# Patient Record
Sex: Male | Born: 1975
Health system: Southern US, Community
[De-identification: ages and names within clinical notes are randomized; demographics above are authoritative.]

## PROBLEM LIST (undated history)

## (undated) DIAGNOSIS — U071 COVID-19: Secondary | ICD-10-CM

## (undated) DIAGNOSIS — K5792 Diverticulitis of intestine, part unspecified, without perforation or abscess without bleeding: Secondary | ICD-10-CM

## (undated) DIAGNOSIS — K219 Gastro-esophageal reflux disease without esophagitis: Secondary | ICD-10-CM

## (undated) DIAGNOSIS — G709 Myoneural disorder, unspecified: Secondary | ICD-10-CM

## (undated) HISTORY — DX: Diverticulitis of intestine, part unspecified, without perforation or abscess without bleeding: K57.92

## (undated) HISTORY — DX: Myoneural disorder, unspecified: G70.9

## (undated) HISTORY — PX: COLONOSCOPY WITH ESOPHAGOGASTRODUODENOSCOPY (EGD): SHX5779

## (undated) HISTORY — DX: Gastro-esophageal reflux disease without esophagitis: K21.9

## (undated) HISTORY — DX: COVID-19: U07.1

---

## 2016-02-10 DIAGNOSIS — K449 Diaphragmatic hernia without obstruction or gangrene: Secondary | ICD-10-CM | POA: Diagnosis not present

## 2016-02-10 DIAGNOSIS — K5730 Diverticulosis of large intestine without perforation or abscess without bleeding: Secondary | ICD-10-CM | POA: Diagnosis not present

## 2016-02-10 DIAGNOSIS — K649 Unspecified hemorrhoids: Secondary | ICD-10-CM | POA: Diagnosis not present

## 2016-02-10 DIAGNOSIS — K228 Other specified diseases of esophagus: Secondary | ICD-10-CM | POA: Diagnosis not present

## 2016-02-10 DIAGNOSIS — K5732 Diverticulitis of large intestine without perforation or abscess without bleeding: Secondary | ICD-10-CM | POA: Diagnosis not present

## 2016-02-10 DIAGNOSIS — K219 Gastro-esophageal reflux disease without esophagitis: Secondary | ICD-10-CM | POA: Diagnosis not present

## 2016-02-10 HISTORY — PX: ESOPHAGOGASTRODUODENOSCOPY: SHX1529

## 2016-02-10 HISTORY — PX: COLONOSCOPY: SHX174

## 2016-06-22 DIAGNOSIS — Z23 Encounter for immunization: Secondary | ICD-10-CM | POA: Diagnosis not present

## 2017-01-25 DIAGNOSIS — Z6829 Body mass index (BMI) 29.0-29.9, adult: Secondary | ICD-10-CM | POA: Diagnosis not present

## 2017-01-25 DIAGNOSIS — E669 Obesity, unspecified: Secondary | ICD-10-CM | POA: Diagnosis not present

## 2017-04-22 DIAGNOSIS — I10 Essential (primary) hypertension: Secondary | ICD-10-CM | POA: Diagnosis not present

## 2017-06-21 DIAGNOSIS — M79601 Pain in right arm: Secondary | ICD-10-CM | POA: Diagnosis not present

## 2017-06-21 DIAGNOSIS — M25511 Pain in right shoulder: Secondary | ICD-10-CM | POA: Diagnosis not present

## 2017-06-21 DIAGNOSIS — M542 Cervicalgia: Secondary | ICD-10-CM | POA: Diagnosis not present

## 2017-06-24 DIAGNOSIS — M62830 Muscle spasm of back: Secondary | ICD-10-CM | POA: Diagnosis not present

## 2017-06-24 DIAGNOSIS — M542 Cervicalgia: Secondary | ICD-10-CM | POA: Diagnosis not present

## 2017-06-24 DIAGNOSIS — M5412 Radiculopathy, cervical region: Secondary | ICD-10-CM | POA: Diagnosis not present

## 2017-06-24 DIAGNOSIS — M791 Myalgia: Secondary | ICD-10-CM | POA: Diagnosis not present

## 2017-06-25 DIAGNOSIS — M25511 Pain in right shoulder: Secondary | ICD-10-CM | POA: Diagnosis not present

## 2017-06-26 DIAGNOSIS — M4802 Spinal stenosis, cervical region: Secondary | ICD-10-CM | POA: Diagnosis not present

## 2017-06-26 DIAGNOSIS — M25511 Pain in right shoulder: Secondary | ICD-10-CM | POA: Diagnosis not present

## 2017-06-26 DIAGNOSIS — M75101 Unspecified rotator cuff tear or rupture of right shoulder, not specified as traumatic: Secondary | ICD-10-CM | POA: Diagnosis not present

## 2017-06-26 DIAGNOSIS — M50223 Other cervical disc displacement at C6-C7 level: Secondary | ICD-10-CM | POA: Diagnosis not present

## 2017-06-27 DIAGNOSIS — M4802 Spinal stenosis, cervical region: Secondary | ICD-10-CM | POA: Diagnosis not present

## 2017-06-27 DIAGNOSIS — M5020 Other cervical disc displacement, unspecified cervical region: Secondary | ICD-10-CM | POA: Diagnosis not present

## 2017-06-27 DIAGNOSIS — S43439A Superior glenoid labrum lesion of unspecified shoulder, initial encounter: Secondary | ICD-10-CM | POA: Diagnosis not present

## 2017-06-28 DIAGNOSIS — M50123 Cervical disc disorder at C6-C7 level with radiculopathy: Secondary | ICD-10-CM | POA: Diagnosis not present

## 2017-06-28 DIAGNOSIS — M48062 Spinal stenosis, lumbar region with neurogenic claudication: Secondary | ICD-10-CM | POA: Diagnosis not present

## 2017-07-02 DIAGNOSIS — M791 Myalgia: Secondary | ICD-10-CM | POA: Diagnosis not present

## 2017-07-02 DIAGNOSIS — M25511 Pain in right shoulder: Secondary | ICD-10-CM | POA: Diagnosis not present

## 2017-07-04 DIAGNOSIS — M791 Myalgia: Secondary | ICD-10-CM | POA: Diagnosis not present

## 2017-07-04 DIAGNOSIS — M25511 Pain in right shoulder: Secondary | ICD-10-CM | POA: Diagnosis not present

## 2017-07-05 DIAGNOSIS — M4712 Other spondylosis with myelopathy, cervical region: Secondary | ICD-10-CM | POA: Diagnosis not present

## 2017-07-05 DIAGNOSIS — M4802 Spinal stenosis, cervical region: Secondary | ICD-10-CM | POA: Diagnosis not present

## 2017-07-05 DIAGNOSIS — M4722 Other spondylosis with radiculopathy, cervical region: Secondary | ICD-10-CM | POA: Diagnosis not present

## 2017-07-05 DIAGNOSIS — M5030 Other cervical disc degeneration, unspecified cervical region: Secondary | ICD-10-CM | POA: Diagnosis not present

## 2017-07-05 DIAGNOSIS — M5000 Cervical disc disorder with myelopathy, unspecified cervical region: Secondary | ICD-10-CM | POA: Diagnosis not present

## 2017-07-08 DIAGNOSIS — M25511 Pain in right shoulder: Secondary | ICD-10-CM | POA: Diagnosis not present

## 2017-07-08 DIAGNOSIS — M791 Myalgia: Secondary | ICD-10-CM | POA: Diagnosis not present

## 2017-07-09 DIAGNOSIS — M62838 Other muscle spasm: Secondary | ICD-10-CM | POA: Diagnosis not present

## 2017-07-09 DIAGNOSIS — M50321 Other cervical disc degeneration at C4-C5 level: Secondary | ICD-10-CM | POA: Diagnosis not present

## 2017-07-09 DIAGNOSIS — M50322 Other cervical disc degeneration at C5-C6 level: Secondary | ICD-10-CM | POA: Diagnosis not present

## 2017-07-09 DIAGNOSIS — M531 Cervicobrachial syndrome: Secondary | ICD-10-CM | POA: Diagnosis not present

## 2017-07-11 DIAGNOSIS — M531 Cervicobrachial syndrome: Secondary | ICD-10-CM | POA: Diagnosis not present

## 2017-07-11 DIAGNOSIS — M50322 Other cervical disc degeneration at C5-C6 level: Secondary | ICD-10-CM | POA: Diagnosis not present

## 2017-07-11 DIAGNOSIS — M62838 Other muscle spasm: Secondary | ICD-10-CM | POA: Diagnosis not present

## 2017-07-11 DIAGNOSIS — M50321 Other cervical disc degeneration at C4-C5 level: Secondary | ICD-10-CM | POA: Diagnosis not present

## 2017-07-12 DIAGNOSIS — M50122 Cervical disc disorder at C5-C6 level with radiculopathy: Secondary | ICD-10-CM | POA: Diagnosis not present

## 2017-07-12 DIAGNOSIS — M50123 Cervical disc disorder at C6-C7 level with radiculopathy: Secondary | ICD-10-CM | POA: Diagnosis not present

## 2017-07-12 DIAGNOSIS — M50322 Other cervical disc degeneration at C5-C6 level: Secondary | ICD-10-CM | POA: Diagnosis not present

## 2017-07-12 DIAGNOSIS — M50323 Other cervical disc degeneration at C6-C7 level: Secondary | ICD-10-CM | POA: Diagnosis not present

## 2017-07-12 DIAGNOSIS — M4802 Spinal stenosis, cervical region: Secondary | ICD-10-CM | POA: Diagnosis not present

## 2017-07-15 DIAGNOSIS — M25511 Pain in right shoulder: Secondary | ICD-10-CM | POA: Diagnosis not present

## 2017-07-15 DIAGNOSIS — M531 Cervicobrachial syndrome: Secondary | ICD-10-CM | POA: Diagnosis not present

## 2017-07-15 DIAGNOSIS — M791 Myalgia, unspecified site: Secondary | ICD-10-CM | POA: Diagnosis not present

## 2017-07-15 DIAGNOSIS — M50321 Other cervical disc degeneration at C4-C5 level: Secondary | ICD-10-CM | POA: Diagnosis not present

## 2017-07-15 DIAGNOSIS — M62838 Other muscle spasm: Secondary | ICD-10-CM | POA: Diagnosis not present

## 2017-07-15 DIAGNOSIS — M50322 Other cervical disc degeneration at C5-C6 level: Secondary | ICD-10-CM | POA: Diagnosis not present

## 2017-07-16 DIAGNOSIS — M50321 Other cervical disc degeneration at C4-C5 level: Secondary | ICD-10-CM | POA: Diagnosis not present

## 2017-07-16 DIAGNOSIS — M50322 Other cervical disc degeneration at C5-C6 level: Secondary | ICD-10-CM | POA: Diagnosis not present

## 2017-07-16 DIAGNOSIS — M62838 Other muscle spasm: Secondary | ICD-10-CM | POA: Diagnosis not present

## 2017-07-16 DIAGNOSIS — M531 Cervicobrachial syndrome: Secondary | ICD-10-CM | POA: Diagnosis not present

## 2017-07-17 DIAGNOSIS — M50321 Other cervical disc degeneration at C4-C5 level: Secondary | ICD-10-CM | POA: Diagnosis not present

## 2017-07-17 DIAGNOSIS — M50322 Other cervical disc degeneration at C5-C6 level: Secondary | ICD-10-CM | POA: Diagnosis not present

## 2017-07-17 DIAGNOSIS — M531 Cervicobrachial syndrome: Secondary | ICD-10-CM | POA: Diagnosis not present

## 2017-07-17 DIAGNOSIS — M62838 Other muscle spasm: Secondary | ICD-10-CM | POA: Diagnosis not present

## 2017-07-18 DIAGNOSIS — M50322 Other cervical disc degeneration at C5-C6 level: Secondary | ICD-10-CM | POA: Diagnosis not present

## 2017-07-18 DIAGNOSIS — M542 Cervicalgia: Secondary | ICD-10-CM | POA: Diagnosis not present

## 2017-07-18 DIAGNOSIS — M50321 Other cervical disc degeneration at C4-C5 level: Secondary | ICD-10-CM | POA: Diagnosis not present

## 2017-07-18 DIAGNOSIS — M25511 Pain in right shoulder: Secondary | ICD-10-CM | POA: Diagnosis not present

## 2017-07-18 DIAGNOSIS — M531 Cervicobrachial syndrome: Secondary | ICD-10-CM | POA: Diagnosis not present

## 2017-07-18 DIAGNOSIS — M62838 Other muscle spasm: Secondary | ICD-10-CM | POA: Diagnosis not present

## 2017-07-22 DIAGNOSIS — M531 Cervicobrachial syndrome: Secondary | ICD-10-CM | POA: Diagnosis not present

## 2017-07-22 DIAGNOSIS — M50321 Other cervical disc degeneration at C4-C5 level: Secondary | ICD-10-CM | POA: Diagnosis not present

## 2017-07-22 DIAGNOSIS — M50322 Other cervical disc degeneration at C5-C6 level: Secondary | ICD-10-CM | POA: Diagnosis not present

## 2017-07-22 DIAGNOSIS — M62838 Other muscle spasm: Secondary | ICD-10-CM | POA: Diagnosis not present

## 2017-07-23 DIAGNOSIS — M50321 Other cervical disc degeneration at C4-C5 level: Secondary | ICD-10-CM | POA: Diagnosis not present

## 2017-07-23 DIAGNOSIS — M531 Cervicobrachial syndrome: Secondary | ICD-10-CM | POA: Diagnosis not present

## 2017-07-23 DIAGNOSIS — M62838 Other muscle spasm: Secondary | ICD-10-CM | POA: Diagnosis not present

## 2017-07-23 DIAGNOSIS — M50322 Other cervical disc degeneration at C5-C6 level: Secondary | ICD-10-CM | POA: Diagnosis not present

## 2017-07-24 DIAGNOSIS — M50322 Other cervical disc degeneration at C5-C6 level: Secondary | ICD-10-CM | POA: Diagnosis not present

## 2017-07-24 DIAGNOSIS — M62838 Other muscle spasm: Secondary | ICD-10-CM | POA: Diagnosis not present

## 2017-07-24 DIAGNOSIS — M50321 Other cervical disc degeneration at C4-C5 level: Secondary | ICD-10-CM | POA: Diagnosis not present

## 2017-07-24 DIAGNOSIS — M531 Cervicobrachial syndrome: Secondary | ICD-10-CM | POA: Diagnosis not present

## 2017-07-25 DIAGNOSIS — M62838 Other muscle spasm: Secondary | ICD-10-CM | POA: Diagnosis not present

## 2017-07-25 DIAGNOSIS — M531 Cervicobrachial syndrome: Secondary | ICD-10-CM | POA: Diagnosis not present

## 2017-07-25 DIAGNOSIS — M50321 Other cervical disc degeneration at C4-C5 level: Secondary | ICD-10-CM | POA: Diagnosis not present

## 2017-07-25 DIAGNOSIS — M50322 Other cervical disc degeneration at C5-C6 level: Secondary | ICD-10-CM | POA: Diagnosis not present

## 2017-07-29 DIAGNOSIS — M50321 Other cervical disc degeneration at C4-C5 level: Secondary | ICD-10-CM | POA: Diagnosis not present

## 2017-07-29 DIAGNOSIS — M531 Cervicobrachial syndrome: Secondary | ICD-10-CM | POA: Diagnosis not present

## 2017-07-29 DIAGNOSIS — M50322 Other cervical disc degeneration at C5-C6 level: Secondary | ICD-10-CM | POA: Diagnosis not present

## 2017-07-29 DIAGNOSIS — M62838 Other muscle spasm: Secondary | ICD-10-CM | POA: Diagnosis not present

## 2017-07-30 DIAGNOSIS — M791 Myalgia, unspecified site: Secondary | ICD-10-CM | POA: Diagnosis not present

## 2017-07-30 DIAGNOSIS — M25511 Pain in right shoulder: Secondary | ICD-10-CM | POA: Diagnosis not present

## 2017-07-31 DIAGNOSIS — M531 Cervicobrachial syndrome: Secondary | ICD-10-CM | POA: Diagnosis not present

## 2017-07-31 DIAGNOSIS — M50321 Other cervical disc degeneration at C4-C5 level: Secondary | ICD-10-CM | POA: Diagnosis not present

## 2017-07-31 DIAGNOSIS — M50322 Other cervical disc degeneration at C5-C6 level: Secondary | ICD-10-CM | POA: Diagnosis not present

## 2017-07-31 DIAGNOSIS — M62838 Other muscle spasm: Secondary | ICD-10-CM | POA: Diagnosis not present

## 2017-08-01 DIAGNOSIS — M50321 Other cervical disc degeneration at C4-C5 level: Secondary | ICD-10-CM | POA: Diagnosis not present

## 2017-08-01 DIAGNOSIS — M62838 Other muscle spasm: Secondary | ICD-10-CM | POA: Diagnosis not present

## 2017-08-01 DIAGNOSIS — M50322 Other cervical disc degeneration at C5-C6 level: Secondary | ICD-10-CM | POA: Diagnosis not present

## 2017-08-01 DIAGNOSIS — M531 Cervicobrachial syndrome: Secondary | ICD-10-CM | POA: Diagnosis not present

## 2017-08-02 DIAGNOSIS — M4802 Spinal stenosis, cervical region: Secondary | ICD-10-CM | POA: Diagnosis not present

## 2017-08-02 DIAGNOSIS — M50122 Cervical disc disorder at C5-C6 level with radiculopathy: Secondary | ICD-10-CM | POA: Diagnosis not present

## 2017-08-05 DIAGNOSIS — M531 Cervicobrachial syndrome: Secondary | ICD-10-CM | POA: Diagnosis not present

## 2017-08-05 DIAGNOSIS — M50321 Other cervical disc degeneration at C4-C5 level: Secondary | ICD-10-CM | POA: Diagnosis not present

## 2017-08-05 DIAGNOSIS — M50322 Other cervical disc degeneration at C5-C6 level: Secondary | ICD-10-CM | POA: Diagnosis not present

## 2017-08-05 DIAGNOSIS — M50123 Cervical disc disorder at C6-C7 level with radiculopathy: Secondary | ICD-10-CM | POA: Diagnosis not present

## 2017-08-05 DIAGNOSIS — M62838 Other muscle spasm: Secondary | ICD-10-CM | POA: Diagnosis not present

## 2017-08-06 DIAGNOSIS — M531 Cervicobrachial syndrome: Secondary | ICD-10-CM | POA: Diagnosis not present

## 2017-08-06 DIAGNOSIS — M4722 Other spondylosis with radiculopathy, cervical region: Secondary | ICD-10-CM | POA: Diagnosis not present

## 2017-08-06 DIAGNOSIS — M791 Myalgia, unspecified site: Secondary | ICD-10-CM | POA: Diagnosis not present

## 2017-08-06 DIAGNOSIS — M50322 Other cervical disc degeneration at C5-C6 level: Secondary | ICD-10-CM | POA: Diagnosis not present

## 2017-08-06 DIAGNOSIS — M50321 Other cervical disc degeneration at C4-C5 level: Secondary | ICD-10-CM | POA: Diagnosis not present

## 2017-08-06 DIAGNOSIS — M25511 Pain in right shoulder: Secondary | ICD-10-CM | POA: Diagnosis not present

## 2017-08-06 DIAGNOSIS — M5030 Other cervical disc degeneration, unspecified cervical region: Secondary | ICD-10-CM | POA: Diagnosis not present

## 2017-08-06 DIAGNOSIS — M4712 Other spondylosis with myelopathy, cervical region: Secondary | ICD-10-CM | POA: Diagnosis not present

## 2017-08-06 DIAGNOSIS — M62838 Other muscle spasm: Secondary | ICD-10-CM | POA: Diagnosis not present

## 2017-08-06 DIAGNOSIS — M4802 Spinal stenosis, cervical region: Secondary | ICD-10-CM | POA: Diagnosis not present

## 2017-08-07 DIAGNOSIS — M50322 Other cervical disc degeneration at C5-C6 level: Secondary | ICD-10-CM | POA: Diagnosis not present

## 2017-08-07 DIAGNOSIS — M62838 Other muscle spasm: Secondary | ICD-10-CM | POA: Diagnosis not present

## 2017-08-07 DIAGNOSIS — M531 Cervicobrachial syndrome: Secondary | ICD-10-CM | POA: Diagnosis not present

## 2017-08-07 DIAGNOSIS — M50321 Other cervical disc degeneration at C4-C5 level: Secondary | ICD-10-CM | POA: Diagnosis not present

## 2017-08-15 DIAGNOSIS — M50322 Other cervical disc degeneration at C5-C6 level: Secondary | ICD-10-CM | POA: Diagnosis not present

## 2017-08-15 DIAGNOSIS — M531 Cervicobrachial syndrome: Secondary | ICD-10-CM | POA: Diagnosis not present

## 2017-08-15 DIAGNOSIS — M50321 Other cervical disc degeneration at C4-C5 level: Secondary | ICD-10-CM | POA: Diagnosis not present

## 2017-08-15 DIAGNOSIS — M62838 Other muscle spasm: Secondary | ICD-10-CM | POA: Diagnosis not present

## 2018-03-28 DIAGNOSIS — M25512 Pain in left shoulder: Secondary | ICD-10-CM | POA: Diagnosis not present

## 2018-03-28 DIAGNOSIS — M542 Cervicalgia: Secondary | ICD-10-CM | POA: Diagnosis not present

## 2018-03-28 DIAGNOSIS — M7582 Other shoulder lesions, left shoulder: Secondary | ICD-10-CM | POA: Diagnosis not present

## 2018-04-15 DIAGNOSIS — K5792 Diverticulitis of intestine, part unspecified, without perforation or abscess without bleeding: Secondary | ICD-10-CM | POA: Diagnosis not present

## 2018-04-15 DIAGNOSIS — J029 Acute pharyngitis, unspecified: Secondary | ICD-10-CM | POA: Diagnosis not present

## 2018-05-21 DIAGNOSIS — M542 Cervicalgia: Secondary | ICD-10-CM | POA: Diagnosis not present

## 2018-07-08 DIAGNOSIS — H40013 Open angle with borderline findings, low risk, bilateral: Secondary | ICD-10-CM | POA: Diagnosis not present

## 2018-07-16 DIAGNOSIS — J029 Acute pharyngitis, unspecified: Secondary | ICD-10-CM | POA: Diagnosis not present

## 2018-07-16 DIAGNOSIS — J209 Acute bronchitis, unspecified: Secondary | ICD-10-CM | POA: Diagnosis not present

## 2018-08-25 DIAGNOSIS — Z6832 Body mass index (BMI) 32.0-32.9, adult: Secondary | ICD-10-CM | POA: Diagnosis not present

## 2018-08-25 DIAGNOSIS — E669 Obesity, unspecified: Secondary | ICD-10-CM | POA: Diagnosis not present

## 2018-12-23 DIAGNOSIS — B349 Viral infection, unspecified: Secondary | ICD-10-CM | POA: Diagnosis not present

## 2018-12-23 DIAGNOSIS — J329 Chronic sinusitis, unspecified: Secondary | ICD-10-CM | POA: Diagnosis not present

## 2019-02-09 DIAGNOSIS — Z20828 Contact with and (suspected) exposure to other viral communicable diseases: Secondary | ICD-10-CM | POA: Diagnosis not present

## 2019-06-30 DIAGNOSIS — F4323 Adjustment disorder with mixed anxiety and depressed mood: Secondary | ICD-10-CM | POA: Diagnosis not present

## 2019-07-07 DIAGNOSIS — F4323 Adjustment disorder with mixed anxiety and depressed mood: Secondary | ICD-10-CM | POA: Diagnosis not present

## 2019-07-14 DIAGNOSIS — F4323 Adjustment disorder with mixed anxiety and depressed mood: Secondary | ICD-10-CM | POA: Diagnosis not present

## 2019-07-21 DIAGNOSIS — F4323 Adjustment disorder with mixed anxiety and depressed mood: Secondary | ICD-10-CM | POA: Diagnosis not present

## 2019-07-29 DIAGNOSIS — F4323 Adjustment disorder with mixed anxiety and depressed mood: Secondary | ICD-10-CM | POA: Diagnosis not present

## 2019-08-04 DIAGNOSIS — F4323 Adjustment disorder with mixed anxiety and depressed mood: Secondary | ICD-10-CM | POA: Diagnosis not present

## 2019-08-25 DIAGNOSIS — F4323 Adjustment disorder with mixed anxiety and depressed mood: Secondary | ICD-10-CM | POA: Diagnosis not present

## 2019-09-01 DIAGNOSIS — F4323 Adjustment disorder with mixed anxiety and depressed mood: Secondary | ICD-10-CM | POA: Diagnosis not present

## 2019-09-15 DIAGNOSIS — F4323 Adjustment disorder with mixed anxiety and depressed mood: Secondary | ICD-10-CM | POA: Diagnosis not present

## 2019-09-29 DIAGNOSIS — F4323 Adjustment disorder with mixed anxiety and depressed mood: Secondary | ICD-10-CM | POA: Diagnosis not present

## 2019-10-13 DIAGNOSIS — F4323 Adjustment disorder with mixed anxiety and depressed mood: Secondary | ICD-10-CM | POA: Diagnosis not present

## 2019-10-23 DIAGNOSIS — R0989 Other specified symptoms and signs involving the circulatory and respiratory systems: Secondary | ICD-10-CM | POA: Diagnosis not present

## 2019-10-31 ENCOUNTER — Emergency Department (HOSPITAL_BASED_OUTPATIENT_CLINIC_OR_DEPARTMENT_OTHER): Payer: BC Managed Care – PPO

## 2019-10-31 ENCOUNTER — Inpatient Hospital Stay (HOSPITAL_BASED_OUTPATIENT_CLINIC_OR_DEPARTMENT_OTHER)
Admission: EM | Admit: 2019-10-31 | Discharge: 2019-11-04 | DRG: 177 | Disposition: A | Discharge status: Home / Self Care | Payer: BC Managed Care – PPO | Attending: Internal Medicine | Admitting: Internal Medicine

## 2019-10-31 ENCOUNTER — Encounter (HOSPITAL_BASED_OUTPATIENT_CLINIC_OR_DEPARTMENT_OTHER): Payer: Self-pay | Admitting: Emergency Medicine

## 2019-10-31 ENCOUNTER — Other Ambulatory Visit: Payer: Self-pay

## 2019-10-31 DIAGNOSIS — Z800 Family history of malignant neoplasm of digestive organs: Secondary | ICD-10-CM

## 2019-10-31 DIAGNOSIS — J1282 Pneumonia due to coronavirus disease 2019: Secondary | ICD-10-CM | POA: Diagnosis present

## 2019-10-31 DIAGNOSIS — U071 COVID-19: Secondary | ICD-10-CM | POA: Diagnosis not present

## 2019-10-31 DIAGNOSIS — Z9981 Dependence on supplemental oxygen: Secondary | ICD-10-CM

## 2019-10-31 DIAGNOSIS — J9601 Acute respiratory failure with hypoxia: Secondary | ICD-10-CM | POA: Diagnosis present

## 2019-10-31 DIAGNOSIS — I2510 Atherosclerotic heart disease of native coronary artery without angina pectoris: Secondary | ICD-10-CM

## 2019-10-31 DIAGNOSIS — J069 Acute upper respiratory infection, unspecified: Secondary | ICD-10-CM

## 2019-10-31 DIAGNOSIS — R030 Elevated blood-pressure reading, without diagnosis of hypertension: Secondary | ICD-10-CM | POA: Diagnosis present

## 2019-10-31 DIAGNOSIS — R0602 Shortness of breath: Secondary | ICD-10-CM | POA: Diagnosis not present

## 2019-10-31 LAB — COMPREHENSIVE METABOLIC PANEL
ALT: 76 U/L — ABNORMAL HIGH (ref 0–44)
AST: 33 U/L (ref 15–41)
Albumin: 3.4 g/dL — ABNORMAL LOW (ref 3.5–5.0)
Alkaline Phosphatase: 56 U/L (ref 38–126)
Anion gap: 10 (ref 5–15)
BUN: 28 mg/dL — ABNORMAL HIGH (ref 6–20)
CO2: 22 mmol/L (ref 22–32)
Calcium: 8.6 mg/dL — ABNORMAL LOW (ref 8.9–10.3)
Chloride: 106 mmol/L (ref 98–111)
Creatinine, Ser: 1.07 mg/dL (ref 0.61–1.24)
GFR calc Af Amer: >60 mL/min (ref >60)
GFR calc non Af Amer: >60 mL/min (ref >60)
Glucose, Bld: 84 mg/dL (ref 70–99)
Potassium: 3.7 mmol/L (ref 3.5–5.1)
Sodium: 138 mmol/L (ref 135–145)
Total Bilirubin: 0.7 mg/dL (ref 0.3–1.2)
Total Protein: 7.5 g/dL (ref 6.5–8.1)

## 2019-10-31 LAB — D-DIMER, QUANTITATIVE: D-Dimer, Quant: 0.69 ug/mL-FEU — ABNORMAL HIGH (ref 0.00–0.50)

## 2019-10-31 LAB — CBC WITH DIFFERENTIAL/PLATELET
Abs Immature Granulocytes: 0.23 10*3/uL — ABNORMAL HIGH (ref 0.00–0.07)
Basophils Absolute: 0 10*3/uL (ref 0.0–0.1)
Basophils Relative: 0 %
Eosinophils Absolute: 0 10*3/uL (ref 0.0–0.5)
Eosinophils Relative: 0 %
HCT: 50 % (ref 39.0–52.0)
Hemoglobin: 17.3 g/dL — ABNORMAL HIGH (ref 13.0–17.0)
Immature Granulocytes: 2 %
Lymphocytes Relative: 8 %
Lymphs Abs: 0.7 10*3/uL (ref 0.7–4.0)
MCH: 30.1 pg (ref 26.0–34.0)
MCHC: 34.6 g/dL (ref 30.0–36.0)
MCV: 87.1 fL (ref 80.0–100.0)
Monocytes Absolute: 1.1 10*3/uL — ABNORMAL HIGH (ref 0.1–1.0)
Monocytes Relative: 12 %
Neutro Abs: 7.4 10*3/uL (ref 1.7–7.7)
Neutrophils Relative %: 78 %
Platelets: 214 10*3/uL (ref 150–400)
RBC: 5.74 MIL/uL (ref 4.22–5.81)
RDW: 12.3 % (ref 11.5–15.5)
WBC: 9.5 10*3/uL (ref 4.0–10.5)
nRBC: 0 % (ref 0.0–0.2)

## 2019-10-31 LAB — LACTATE DEHYDROGENASE: LDH: 221 U/L — ABNORMAL HIGH (ref 98–192)

## 2019-10-31 LAB — LACTIC ACID, PLASMA: Lactic Acid, Venous: 1.2 mmol/L (ref 0.5–1.9)

## 2019-10-31 LAB — C-REACTIVE PROTEIN: CRP: 6.4 mg/dL — ABNORMAL HIGH (ref <1.0)

## 2019-10-31 LAB — FIBRINOGEN: Fibrinogen: 649 mg/dL — ABNORMAL HIGH (ref 210–475)

## 2019-10-31 LAB — FERRITIN: Ferritin: 651 ng/mL — ABNORMAL HIGH (ref 24–336)

## 2019-10-31 LAB — TRIGLYCERIDES: Triglycerides: 70 mg/dL (ref <150)

## 2019-10-31 LAB — PROCALCITONIN: Procalcitonin: <0.1 ng/mL

## 2019-10-31 LAB — SARS CORONAVIRUS 2 AG (30 MIN TAT): SARS Coronavirus 2 Ag: POSITIVE — AB

## 2019-10-31 MED ORDER — ACETAMINOPHEN 500 MG PO TABS
1000.0000 mg | ORAL_TABLET | Freq: Once | ORAL | Status: AC
  Administered 2019-10-31: 1000 mg via ORAL
  Filled 2019-10-31: qty 2

## 2019-10-31 MED ORDER — SODIUM CHLORIDE 0.9 % IV SOLN
100.0000 mg | Freq: Every day | INTRAVENOUS | Status: AC
  Administered 2019-11-01 – 2019-11-04 (×4): 100 mg via INTRAVENOUS
  Filled 2019-10-31 (×4): qty 20

## 2019-10-31 MED ORDER — SODIUM CHLORIDE 0.9 % IV SOLN
INTRAVENOUS | Status: DC | PRN
  Administered 2019-11-02: 1000 mL via INTRAVENOUS

## 2019-10-31 MED ORDER — DEXAMETHASONE 6 MG PO TABS
6.0000 mg | ORAL_TABLET | Freq: Once | ORAL | Status: AC
  Administered 2019-10-31: 6 mg via ORAL
  Filled 2019-10-31: qty 1

## 2019-10-31 MED ORDER — SODIUM CHLORIDE 0.9 % IV SOLN
10.0000 mL/h | Freq: Once | INTRAVENOUS | Status: AC
  Administered 2019-10-31: 10 mL/h via INTRAVENOUS

## 2019-10-31 MED ORDER — SODIUM CHLORIDE 0.9 % IV SOLN
100.0000 mg | INTRAVENOUS | Status: AC
  Administered 2019-10-31 (×2): 100 mg via INTRAVENOUS
  Filled 2019-10-31 (×2): qty 20

## 2019-10-31 NOTE — ED Notes (Signed)
ED Provider at bedside. 

## 2019-10-31 NOTE — Progress Notes (Signed)
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 Started on remdesivir and cont on steroids. Has been consented for convalescent plasma. Accepted to telemetry.

## 2019-10-31 NOTE — ED Triage Notes (Signed)
Pt states that he is COVID + he states that he has had SOB and on Thursday he started to get more SOB. The patient states that the is worse with walking around. The patient states that he has increased WOB with walking and decreased sats.

## 2019-10-31 NOTE — ED Notes (Signed)
ED Provider at bedside. Pt placed on 2LPM O2 via [place] per EDP verbal order while at bedside.

## 2019-10-31 NOTE — Progress Notes (Signed)
Pt given a flutter and an IS per his request.

## 2019-10-31 NOTE — ED Notes (Signed)
Pt on cardiac monitor and auto VS 

## 2019-10-31 NOTE — ED Notes (Signed)
PULSE OX AMBULATING: SpO2 94% before starting and then dropped to 90% and pt complained of increased short of breath with the exertion. HR started at 102bpm and got up to 115bpm. Once sitting in bed, BP lowered to 95bpm and SpO2 up to 93% within 5 mins

## 2019-10-31 NOTE — ED Provider Notes (Signed)
[facility] EMERGENCY DEPARTMENT Provider Note   CSN: [id number] Arrival date & time: 10/31/19  1213     History Chief Complaint  Patient presents with  . Shortness of Breath    Cesar Pratt is a 44 y.o. male.  The history is provided by the patient.  Shortness of Breath Severity:  Moderate Onset quality:  Gradual Timing:  Constant Progression:  Worsening Chronicity:  New Context: URI (COVID for the last 8 days but worsening SOB and hypoxia with exertion today. Has been taking decadron. )   Relieved by:  Rest Worsened by:  Exertion Associated symptoms: cough and fever   Associated symptoms: no abdominal pain, no chest pain, no ear pain, no rash, no sore throat and no vomiting   Risk factors: no hx of PE/DVT        History reviewed. No pertinent past medical history.  Patient Active Problem List   Diagnosis Date Noted  . Acute respiratory failure with hypoxia ([place]) 10/31/2019  . Pneumonia due to COVID-19 virus 10/31/2019    History reviewed. No pertinent surgical history.     No family history on file.  Social History   Tobacco Use  . Smoking status: Never Smoker  . Smokeless tobacco: Never Used  Substance Use Topics  . Alcohol use: Never  . Drug use: Never    Home Medications Prior to Admission medications   Not on File    Allergies    Patient has no known allergies.  Review of Systems   Review of Systems  Constitutional: Positive for fever. Negative for chills.  HENT: Negative for ear pain and sore throat.   Eyes: Negative for pain and visual disturbance.  Respiratory: Positive for cough and shortness of breath.   Cardiovascular: Negative for chest pain and palpitations.  Gastrointestinal: Negative for abdominal pain and vomiting.  Genitourinary: Negative for dysuria and hematuria.  Musculoskeletal: Negative for arthralgias and back pain.  Skin: Negative for color change and rash.  Neurological: Negative for seizures and  syncope.  All other systems reviewed and are negative.   Physical Exam Updated Vital Signs  ED Triage Vitals  Enc Vitals Group     BP 10/31/19 1220 (!) 165/110     Pulse Rate 10/31/19 1220 99     Resp 10/31/19 1220 20     Temp 10/31/19 1220 (!) 100.6 F (38.1 C)     Temp Source 10/31/19 1220 Oral     SpO2 10/31/19 1220 95 %     Weight 10/31/19 1221 200 lb (90.7 kg)     Height 10/31/19 1221 5\' 8"  (1.727 m)     Head Circumference --      Peak Flow --      Pain Score 10/31/19 1221 0     Pain Loc --      Pain Edu? --      Excl. in [place]? --     Physical Exam Vitals and nursing note reviewed.  Constitutional:      General: He is not in acute distress.    Appearance: He is well-developed. He is not ill-appearing.  HENT:     Head: Normocephalic and atraumatic.  Eyes:     Conjunctiva/sclera: Conjunctivae normal.     Pupils: Pupils are equal, round, and reactive to light.  Cardiovascular:     Rate and Rhythm: Normal rate and regular rhythm.     Pulses: Normal pulses.     Heart sounds: Normal heart sounds. No murmur.  Pulmonary:  Effort: Pulmonary effort is normal. Tachypnea present. No respiratory distress.     Breath sounds: Decreased breath sounds present.  Abdominal:     Palpations: Abdomen is soft.     Tenderness: There is no abdominal tenderness.  Musculoskeletal:        General: Normal range of motion.     Cervical back: Normal range of motion and neck supple.     Right lower leg: No edema.     Left lower leg: No edema.  Skin:    General: Skin is warm and dry.     Capillary Refill: Capillary refill takes less than 2 seconds.  Neurological:     General: No focal deficit present.     Mental Status: He is alert.  Psychiatric:        Mood and Affect: Mood normal.     ED Results / Procedures / Treatments   Labs (all labs ordered are listed, but only abnormal results are displayed) Labs Reviewed  SARS CORONAVIRUS 2 AG (30 MIN TAT) - Abnormal; Notable for the  following components:      Result Value   SARS Coronavirus 2 Ag POSITIVE (*)    All other components within normal limits  CBC WITH DIFFERENTIAL/PLATELET - Abnormal; Notable for the following components:   Hemoglobin 17.3 (*)    Monocytes Absolute 1.1 (*)    Abs Immature Granulocytes 0.23 (*)    All other components within normal limits  COMPREHENSIVE METABOLIC PANEL - Abnormal; Notable for the following components:   BUN 28 (*)    Calcium 8.6 (*)    Albumin 3.4 (*)    ALT 76 (*)    All other components within normal limits  D-DIMER, QUANTITATIVE (NOT AT [facility]) - Abnormal; Notable for the following components:   D-Dimer, Quant 0.69 (*)    All other components within normal limits  CULTURE, BLOOD (ROUTINE X 2)  CULTURE, BLOOD (ROUTINE X 2)  LACTIC ACID, PLASMA  LACTIC ACID, PLASMA  PROCALCITONIN  LACTATE DEHYDROGENASE  FERRITIN  TRIGLYCERIDES  FIBRINOGEN  C-REACTIVE PROTEIN  PREPARE FRESH FROZEN PLASMA    EKG EKG Interpretation  Date/Time:  Saturday October 31 2019 12:45:56 EST Ventricular Rate:  97 PR Interval:    QRS Duration: 92 QT Interval:  351 QTC Calculation: 446 R Axis:   -13 Text Interpretation: Sinus rhythm Probable left atrial enlargement Borderline T wave abnormalities Confirmed by Lennice Sites [phone]) on 10/31/2019 12:56:05 PM   Radiology DG Chest Port 1 View  Result Date: 10/31/2019 CLINICAL DATA:  COVID positive, shortness of breath EXAM: PORTABLE CHEST 1 VIEW COMPARISON:  None. FINDINGS: Low lung volumes with minor lower lung bandlike opacities more compatible with atelectasis. Normal heart size and vascularity. Negative for edema, effusion or pneumothorax. Trachea midline. IMPRESSION: Low volume exam with bibasilar atelectasis. Electronically Signed   By: Jerilynn Mages.  Shick M.D.   On: 10/31/2019 13:12    Procedures Procedures (including critical care time)  Medications Ordered in ED Medications  0.9 %  sodium chloride infusion (has no administration in  time range)  remdesivir 100 mg in sodium chloride 0.9 % 100 mL IVPB (has no administration in time range)    Followed by  remdesivir 100 mg in sodium chloride 0.9 % 100 mL IVPB (has no administration in time range)  0.9 %  sodium chloride infusion (has no administration in time range)  dexamethasone (DECADRON) tablet 6 mg (6 mg Oral Given 10/31/19 1303)  acetaminophen (TYLENOL) tablet 1,000 mg (1,000 mg Oral Given  10/31/19 1303)    ED Course  I have reviewed the triage vital signs and the nursing notes.  Pertinent labs & imaging results that were available during my care of the patient were reviewed by me and considered in my medical decision making (see chart for details).    MDM Rules/Calculators/A&P  KAYO VIDALS is a 44 year old male with no significant medical problems who presents to the ED with shortness of breath, fever in the setting of known coronavirus.  Patient with fever of 100.6.  Mild tachypnea.  Hypoxic with ambulation and placed on 2 L of oxygen.  Has been taking Decadron for the last 7 to 8 days.  Tested positive for coronavirus last week.  However symptoms have gradually gotten worse over the last 5 days.  Started to notice hypoxia with exertion at home in the 80s.  Was instructed to come to the ED for evaluation.  He has some decreased air movement in the bases, mild increased work of breathing but otherwise stable on 2 L of oxygen.  Will give IV Decadron.  Will get coronavirus screening labs and anticipate admission for IV redemisivir.  Covid continues to be positive.  Chest x-ray with bibasilar atelectasis.  Otherwise no significant anemia, electrolyte abnormality, kidney injury.  Ordered IV redemisvir and convalescent plasma per hospitalist request.  Patient to be admitted to [facility] long for further care.  Hemodynamically stable throughout my care.  Respiratory failure in the setting of coronavirus.  This chart was dictated using voice recognition software.  Despite best  efforts to proofread,  errors can occur which can change the documentation meaning.     Final Clinical Impression(s) / ED Diagnoses Final diagnoses:  Acute respiratory failure with hypoxia [facility])  COVID-19    Rx / DC Orders ED Discharge Orders    None       Lennice Sites, DO 10/31/19 1538

## 2019-10-31 NOTE — ED Notes (Signed)
Portable Xray at bedside.

## 2019-11-01 ENCOUNTER — Inpatient Hospital Stay (HOSPITAL_BASED_OUTPATIENT_CLINIC_OR_DEPARTMENT_OTHER): Payer: BC Managed Care – PPO

## 2019-11-01 ENCOUNTER — Encounter (HOSPITAL_COMMUNITY): Payer: Self-pay | Admitting: Internal Medicine

## 2019-11-01 LAB — CBC WITH DIFFERENTIAL/PLATELET
Abs Immature Granulocytes: 0.3 10*3/uL — ABNORMAL HIGH (ref 0.00–0.07)
Basophils Absolute: 0.1 10*3/uL (ref 0.0–0.1)
Basophils Relative: 1 %
Eosinophils Absolute: 0 10*3/uL (ref 0.0–0.5)
Eosinophils Relative: 0 %
HCT: 48.1 % (ref 39.0–52.0)
Hemoglobin: 16.9 g/dL (ref 13.0–17.0)
Immature Granulocytes: 3 %
Lymphocytes Relative: 6 %
Lymphs Abs: 0.5 10*3/uL — ABNORMAL LOW (ref 0.7–4.0)
MCH: 30.7 pg (ref 26.0–34.0)
MCHC: 35.1 g/dL (ref 30.0–36.0)
MCV: 87.3 fL (ref 80.0–100.0)
Monocytes Absolute: 0.3 10*3/uL (ref 0.1–1.0)
Monocytes Relative: 4 %
Neutro Abs: 7.9 10*3/uL — ABNORMAL HIGH (ref 1.7–7.7)
Neutrophils Relative %: 86 %
Platelets: 205 10*3/uL (ref 150–400)
RBC: 5.51 MIL/uL (ref 4.22–5.81)
RDW: 12.7 % (ref 11.5–15.5)
WBC: 9.1 10*3/uL (ref 4.0–10.5)
nRBC: 0 % (ref 0.0–0.2)

## 2019-11-01 LAB — FIBRINOGEN: Fibrinogen: 633 mg/dL — ABNORMAL HIGH (ref 210–475)

## 2019-11-01 LAB — D-DIMER, QUANTITATIVE: D-Dimer, Quant: 0.66 ug/mL-FEU — ABNORMAL HIGH (ref 0.00–0.50)

## 2019-11-01 MED ORDER — ACETAMINOPHEN 500 MG PO TABS: 1000.0000 mg | ORAL_TABLET | Freq: Four times a day (QID) | ORAL | Status: DC | PRN

## 2019-11-01 MED ORDER — HYDRALAZINE HCL 20 MG/ML IJ SOLN: 5.0000 mg | Freq: Four times a day (QID) | INTRAMUSCULAR | Status: DC | PRN

## 2019-11-01 MED ORDER — HYDROCOD POLST-CPM POLST ER 10-8 MG/5ML PO SUER
5.0000 mL | Freq: Two times a day (BID) | ORAL | Status: AC
  Administered 2019-11-01 – 2019-11-04 (×6): 5 mL via ORAL
  Filled 2019-11-01 (×6): qty 5

## 2019-11-01 MED ORDER — FUROSEMIDE 10 MG/ML IJ SOLN: 20.0000 mg | Freq: Every day | INTRAMUSCULAR | Status: DC

## 2019-11-01 MED ORDER — SODIUM CHLORIDE 0.9% IV SOLUTION: Freq: Once | INTRAVENOUS | Status: AC

## 2019-11-01 MED ORDER — SODIUM CHLORIDE 0.9 % IV BOLUS
500.0000 mL | Freq: Once | INTRAVENOUS | Status: AC
  Administered 2019-11-01: 500 mL via INTRAVENOUS

## 2019-11-01 MED ORDER — LEVALBUTEROL TARTRATE 45 MCG/ACT IN AERO
2.0000 | INHALATION_SPRAY | Freq: Three times a day (TID) | RESPIRATORY_TRACT | Status: DC
  Administered 2019-11-01 – 2019-11-03 (×7): 2 via RESPIRATORY_TRACT
  Filled 2019-11-01: qty 15

## 2019-11-01 MED ORDER — ENOXAPARIN SODIUM 40 MG/0.4ML ~~LOC~~ SOLN
40.0000 mg | SUBCUTANEOUS | Status: DC
  Administered 2019-11-01 – 2019-11-03 (×3): 40 mg via SUBCUTANEOUS
  Filled 2019-11-01 (×3): qty 0.4

## 2019-11-01 MED ORDER — DEXAMETHASONE SODIUM PHOSPHATE 10 MG/ML IJ SOLN
6.0000 mg | INTRAMUSCULAR | Status: DC
  Administered 2019-11-01 – 2019-11-03 (×3): 6 mg via INTRAVENOUS
  Filled 2019-11-01 (×3): qty 1

## 2019-11-01 MED ORDER — ACETAMINOPHEN 325 MG PO TABS: 650.0000 mg | ORAL_TABLET | Freq: Four times a day (QID) | ORAL | Status: DC | PRN

## 2019-11-01 MED ORDER — ONDANSETRON HCL 4 MG/2ML IJ SOLN: 4.0000 mg | Freq: Four times a day (QID) | INTRAMUSCULAR | Status: DC | PRN

## 2019-11-01 MED ORDER — ASCORBIC ACID 500 MG PO TABS
500.0000 mg | ORAL_TABLET | Freq: Every day | ORAL | Status: DC
  Administered 2019-11-01 – 2019-11-04 (×4): 500 mg via ORAL
  Filled 2019-11-01 (×4): qty 1

## 2019-11-01 MED ORDER — ZINC SULFATE 220 (50 ZN) MG PO CAPS
220.0000 mg | ORAL_CAPSULE | Freq: Every day | ORAL | Status: DC
  Administered 2019-11-01 – 2019-11-04 (×4): 220 mg via ORAL
  Filled 2019-11-01 (×4): qty 1

## 2019-11-01 MED ORDER — IOHEXOL 350 MG/ML SOLN
100.0000 mL | Freq: Once | INTRAVENOUS | Status: AC | PRN
  Administered 2019-11-01: 72 mL via INTRAVENOUS

## 2019-11-01 MED ORDER — IPRATROPIUM BROMIDE HFA 17 MCG/ACT IN AERS
2.0000 | INHALATION_SPRAY | Freq: Three times a day (TID) | RESPIRATORY_TRACT | Status: DC
  Administered 2019-11-01 – 2019-11-03 (×7): 2 via RESPIRATORY_TRACT
  Filled 2019-11-01: qty 12.9

## 2019-11-01 MED ORDER — VITAMIN D 25 MCG (1000 UNIT) PO TABS
1000.0000 [IU] | ORAL_TABLET | Freq: Every day | ORAL | Status: DC
  Administered 2019-11-01 – 2019-11-04 (×4): 1000 [IU] via ORAL
  Filled 2019-11-01 (×4): qty 1

## 2019-11-01 NOTE — ED Notes (Signed)
RN Casimer Bilis on floor unable to take call r/t being at lunch when calling report. Direct number [phone]

## 2019-11-01 NOTE — Progress Notes (Signed)
Patient has verbally consented for Convalescent plasma.  Ordered.  Consent form will need to be signed prior to administration.

## 2019-11-01 NOTE — ED Notes (Signed)
Pt proning at this time. Sleeping comfortably.

## 2019-11-01 NOTE — ED Notes (Signed)
ED Provider at bedside. 

## 2019-11-01 NOTE — ED Notes (Signed)
Called [facility] to advise of bed ready spoke to [place] who stated that [facility] is on theh  Way to pick up pt @ 1428

## 2019-11-01 NOTE — ED Notes (Signed)
ED MD informed of [facility] results

## 2019-11-01 NOTE — ED Notes (Signed)
Patient transported to CT 

## 2019-11-01 NOTE — ED Notes (Signed)
Pt belongings packaged in 2 patient belongings bag with patient labels.

## 2019-11-01 NOTE — ED Provider Notes (Signed)
Patient assessed pending transfer for admission for COVID 19 pneumonia requiring supplemental oxygen.    Pt resting comfortably on assessment on 2L [place]  VTE ppx ordered pending inpatient bed availability.  Will continue decadron and Remdesivir.    Blood culture 1/2 positive for gram positive cocci - favor contaminant as current clinical picture is not c/w sepsis/bacteremia.     Quintella Reichert, MD 11/01/19 [phone]

## 2019-11-01 NOTE — H&P (Addendum)
History and Physical  Cesar Pratt MPN:[id number] DOB: 1975/12/10 DOA: 10/31/2019  Referring physician: Dr. 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  Associated with a non productive cough and a runny nose.  No GI symptoms.  Checked his O2 saturation at home and it was dropping into the mid 80's on room air with minimal ambulation.  Completed 7 days of 500 mg daily of po azithromycin and 7 days of 6 mg daily po decadron. 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 Was incidentally found positive at his outpatient's clinic while demonstrating to his staff how to use testing kit.  Presented to [facility] ED for further evaluation.  Found to be hypoxic in the ED requiring 2 L oxygen supplementation to maintain sats above 90%.  Elevated inflammatory markers.  CT angio negative for PE however showed bilateral pulmonary infiltrates with groundglass appearance suggestive of COVID-19 viral pneumonia.  Febrile on presentation, blood cultures obtained 1 out of 2 bottles positive for gram-positive cocci from aerobic bottle only, likely contaminant.  [facility] was asked to admit as a transfer from [facility].  In-house COVID-19 screening test positive on 10/31/2019.  Started on Remdesivir in the ED.  ED Course: Direct admit from [facility] ED.  Review of Systems: Review of systems as noted in the HPI. All other systems reviewed and are negative.   History reviewed. No pertinent past medical history.  Except for positive COVID-19 viral infection 8 to 10 days prior to presentation. History reviewed. No pertinent surgical history.  Social History:  reports  that he has never smoked. He has never used smokeless tobacco. He reports that he does not drink alcohol or use drugs.   No Known Allergies  History reviewed. No pertinent family history.  His father deceased 7 years ago from gastric cancer.  Prior to Admission medications   Not on File    Physical Exam: BP (!) 166/100 (BP Location: Left Arm)   Pulse 77   Temp 98.5 F (36.9 C) (Oral)   Resp 20   Ht _0  (1.727 m)   Wt 90.7 kg   SpO2 96%   BMI 30.41 kg/m   . General: 44 y.o. year-old male well developed well nourished in no acute distress.  Alert and oriented x3. . Cardiovascular: Regular rate and rhythm with no rubs or gallops.  No thyromegaly or JVD noted.  No lower extremity edema. 2/4 pulses in all 4 extremities. Marland Kitchen Respiratory: Diffused rales bilaterally. No wheezing noted.  Good inspiratory effort. . Abdomen: Soft nontender nondistended with normal bowel sounds x4 quadrants. . Muskuloskeletal: No cyanosis, clubbing or edema noted bilaterally . Neuro: CN II-XII intact, strength, sensation, reflexes . Skin: No ulcerative lesions noted or rashes . Psychiatry: Judgement and insight appear normal. Mood is appropriate for condition and setting          Labs on Admission:  Basic Metabolic Panel: Recent Labs  Lab 10/31/19 1332  NA 138  K 3.7  CL 106  CO2 22  GLUCOSE 84  BUN 28*  CREATININE 1.07  CALCIUM 8.6*   Liver Function Tests: Recent Labs  Lab 10/31/19 1332  AST 33  ALT 76*  ALKPHOS 56  BILITOT 0.7  PROT 7.5  ALBUMIN 3.4*   No results for input(s): LIPASE, AMYLASE in the last 168 hours. No results for input(s): AMMONIA in the last 168 hours. CBC: Recent Labs  Lab 10/31/19 1332  WBC 9.5  NEUTROABS 7.4  HGB 17.3*  HCT 50.0  MCV 87.1  PLT 214   Cardiac Enzymes: No results for input(s): CKTOTAL, CKMB, CKMBINDEX, TROPONINI in the last 168 hours.  BNP (last 3 results) No results for input(s): BNP in the last 8760 hours.  ProBNP (last 3  results) No results for input(s): PROBNP in the last 8760 hours.  CBG: No results for input(s): GLUCAP in the last 168 hours.  Radiological Exams on Admission: CT Angio Chest PE W and/or Wo Contrast  Result Date: 11/01/2019 CLINICAL DATA:  Positive D-dimer. Concern for pulmonary embolism. COVID positive. Increasing short of breath. EXAM: CT ANGIOGRAPHY CHEST WITH CONTRAST TECHNIQUE: Multidetector CT imaging of the chest was performed using the standard protocol during bolus administration of intravenous contrast. Multiplanar CT image reconstructions and MIPs were obtained to evaluate the vascular anatomy. CONTRAST:  38m OMNIPAQUE IOHEXOL 350 MG/ML SOLN COMPARISON:  Chest radiograph 10/31/2019 FINDINGS: Cardiovascular: No filling defects within the pulmonary arteries to suggest acute pulmonary embolism. No acute findings of the aorta or great vessels. No pericardial fluid. Mediastinum/Nodes: No axillary supraclavicular adenopathy. No mediastinal hilar adenopathy. No pericardial effusion. Esophagus normal. Lungs/Pleura: Linear interstitial thickening within the upper lobes and lower lobes. There is scattered ground-glass densities within the interstitial thickening primarily in the upper lobes. No pleural fluid. No pneumothorax Upper Abdomen: Limited view of the liver, kidneys, pancreas are unremarkable. Normal adrenal glands. Musculoskeletal: No aggressive osseous lesion. Review of the MIP images confirms the above findings. IMPRESSION: 1. No acute pulmonary embolism. 2. Diffuse interstitial thickening with interspersed ground-glass opacities consistent with COVID viral infection. Electronically Signed   By: SSuzy BouchardM.D.   On: 11/01/2019 09:52   DG Chest Port 1 View  Result Date: 10/31/2019 CLINICAL DATA:  COVID positive, shortness of breath EXAM: PORTABLE CHEST 1 VIEW COMPARISON:  None. FINDINGS: Low lung volumes with minor lower lung bandlike opacities more compatible with atelectasis. Normal  heart size and vascularity. Negative for edema, effusion or pneumothorax. Trachea midline. IMPRESSION: Low volume exam with bibasilar atelectasis. Electronically Signed   By: MJerilynn Mages  Shick M.D.   On: 10/31/2019 13:12    EKG: I independently viewed the EKG done and my findings are as followed: Sinus rhythm rate of 97 with no specific ST-T changes.  QTc 446.  Assessment/Plan Present on Admission: . Acute respiratory failure with hypoxia (H[facility])  Active Problems:   Acute respiratory failure with hypoxia ([facility])   Pneumonia due to COVID-19 virus  Acute COVID-19 viral pneumonia, POA Self-reported positive COVID-19 test 10 days prior to presentation, will need to obtain records.  Patient states records will be provided tomorrow. Presented with Tmax 100.6 In-house positive COVID-19 screening test on 10/31/2019.  Will need to quarantine for 21 days from first positive COVID-19 test. CTA negative for PE, independently reviewed CT chest which showed bilateral pulmonary infiltrates with groundglass appearance suggestive of COVID-19 viral pneumonia. Inflammatory markers elevated, trend with daily labs Remdesivir was started on 10/31/2019, complete 5 days, will end on 11/04/2019. Started IV Decadron 6 mg daily x10 days. Started bronchodilators Xopenex inhaler 2 puffs 3 times daily and ipratropium inhaler 2 puffs 3 times daily Started IV lasix 20 mg daily x 2 days  to maintain euvolemia Started incentive spirometer every 1 hour while awake, flutter valve every 4 hours while awake Started vitamin D3, C and zinc Maintain O2 saturation greater than 92%.  Currently requiring 2 L to maintain O2 saturation greater than 90%. Mobilize as tolerated Consider transfer to [facility] for Convalescent plasma or if oxygen requirement increases.  Patient has consented to plasma. Closely monitor on telemetry  Acute hypoxic respiratory failure secondary to acute COVID-19 viral pneumonia Management as stated above.  Elevated blood  pressures with no prior history of hypertension Continue to monitor vital signs Hold off antihypertensives for now BNP ordered once will diurese gently to maintain euvolemia Started IV lasix 20 mg daily x 2 days  Gram-positive cocci bacteremia likely contaminant  1 out of 2 positive gram positive cocci from my aerobic bottle only Continue to follow cultures  Procalcitonin negative Continue to monitor fever curve and WBCs  Fever likely related to COVID-19 viral infection Continue to monitor fever curve Treat fever as indicated  Isolated elevated AST likely secondary to COVID-19 viral infection Presented with ALT 76 Continue daily CMP's while on remdesivir Continue to monitor LFTs Avoid hepatotoxic agents     DVT prophylaxis: Subcu Lovenox daily  Code Status: Full code  Family Communication: None at bedside  Disposition Plan: Admit to telemetry unit  Consults called: None  Admission status: Inpatient status    Kayleen Memos MD [facility] Pager [phone]  If 7PM-7AM, please contact night-coverage [email or web address] Password [facility]  11/01/2019, 6:55 PM

## 2019-11-01 NOTE — ED Notes (Signed)
Pt requested a soft drink from vending machine. Provided for patient

## 2019-11-01 NOTE — Progress Notes (Signed)
Holding off IV lasix for now due to recent IV contrast use from chest CTA to rule out PE.  BNP pending.  Maintain euvolemia or negative fluid balance.

## 2019-11-01 NOTE — ED Notes (Signed)
Pt comfortable, awaiting bed placement.  Pt contacting colleagues to assist in bed placement.

## 2019-11-01 NOTE — ED Notes (Signed)
Cesar Pratt will call back to receive report

## 2019-11-02 DIAGNOSIS — J1282 Pneumonia due to coronavirus disease 2019: Secondary | ICD-10-CM

## 2019-11-02 DIAGNOSIS — U071 COVID-19: Principal | ICD-10-CM

## 2019-11-02 LAB — COMPREHENSIVE METABOLIC PANEL
ALT: 68 U/L — ABNORMAL HIGH (ref 0–44)
ALT: 71 U/L — ABNORMAL HIGH (ref 0–44)
AST: 30 U/L (ref 15–41)
AST: 28 U/L (ref 15–41)
Albumin: 3.1 g/dL — ABNORMAL LOW (ref 3.5–5.0)
Albumin: 3.2 g/dL — ABNORMAL LOW (ref 3.5–5.0)
Alkaline Phosphatase: 60 U/L (ref 38–126)
Alkaline Phosphatase: 61 U/L (ref 38–126)
Anion gap: 9 (ref 5–15)
Anion gap: 8 (ref 5–15)
BUN: 31 mg/dL — ABNORMAL HIGH (ref 6–20)
BUN: 32 mg/dL — ABNORMAL HIGH (ref 6–20)
CO2: 22 mmol/L (ref 22–32)
CO2: 22 mmol/L (ref 22–32)
Calcium: 8.3 mg/dL — ABNORMAL LOW (ref 8.9–10.3)
Calcium: 8.5 mg/dL — ABNORMAL LOW (ref 8.9–10.3)
Chloride: 107 mmol/L (ref 98–111)
Chloride: 109 mmol/L (ref 98–111)
Creatinine, Ser: 1.12 mg/dL (ref 0.61–1.24)
Creatinine, Ser: 0.92 mg/dL (ref 0.61–1.24)
GFR calc Af Amer: >60 mL/min (ref >60)
GFR calc Af Amer: >60 mL/min (ref >60)
GFR calc non Af Amer: >60 mL/min (ref >60)
GFR calc non Af Amer: >60 mL/min (ref >60)
Glucose, Bld: 220 mg/dL — ABNORMAL HIGH (ref 70–99)
Glucose, Bld: 156 mg/dL — ABNORMAL HIGH (ref 70–99)
Potassium: 4 mmol/L (ref 3.5–5.1)
Potassium: 4.3 mmol/L (ref 3.5–5.1)
Sodium: 138 mmol/L (ref 135–145)
Sodium: 139 mmol/L (ref 135–145)
Total Bilirubin: 0.4 mg/dL (ref 0.3–1.2)
Total Bilirubin: 0.6 mg/dL (ref 0.3–1.2)
Total Protein: 7 g/dL (ref 6.5–8.1)
Total Protein: 7.2 g/dL (ref 6.5–8.1)

## 2019-11-02 LAB — CBC WITH DIFFERENTIAL/PLATELET
Abs Immature Granulocytes: 0.3 10*3/uL — ABNORMAL HIGH (ref 0.00–0.07)
Basophils Absolute: 0 10*3/uL (ref 0.0–0.1)
Basophils Relative: 1 %
Eosinophils Absolute: 0 10*3/uL (ref 0.0–0.5)
Eosinophils Relative: 0 %
HCT: 49.5 % (ref 39.0–52.0)
Hemoglobin: 16.6 g/dL (ref 13.0–17.0)
Immature Granulocytes: 5 %
Lymphocytes Relative: 9 %
Lymphs Abs: 0.5 10*3/uL — ABNORMAL LOW (ref 0.7–4.0)
MCH: 29.8 pg (ref 26.0–34.0)
MCHC: 33.5 g/dL (ref 30.0–36.0)
MCV: 88.9 fL (ref 80.0–100.0)
Monocytes Absolute: 0.4 10*3/uL (ref 0.1–1.0)
Monocytes Relative: 6 %
Neutro Abs: 4.9 10*3/uL (ref 1.7–7.7)
Neutrophils Relative %: 79 %
Platelets: 222 10*3/uL (ref 150–400)
RBC: 5.57 MIL/uL (ref 4.22–5.81)
RDW: 12.7 % (ref 11.5–15.5)
WBC: 6.2 10*3/uL (ref 4.0–10.5)
nRBC: 0 % (ref 0.0–0.2)

## 2019-11-02 LAB — CULTURE, BLOOD (ROUTINE X 2): Special Requests: ADEQUATE

## 2019-11-02 LAB — D-DIMER, QUANTITATIVE: D-Dimer, Quant: 0.55 ug/mL-FEU — ABNORMAL HIGH (ref 0.00–0.50)

## 2019-11-02 LAB — TYPE AND SCREEN
ABO/RH(D): O POS
Antibody Screen: NEGATIVE

## 2019-11-02 LAB — ABO/RH: ABO/RH(D): O POS

## 2019-11-02 LAB — LACTATE DEHYDROGENASE
LDH: 191 U/L (ref 98–192)
LDH: 180 U/L (ref 98–192)

## 2019-11-02 LAB — FERRITIN
Ferritin: 746 ng/mL — ABNORMAL HIGH (ref 24–336)
Ferritin: 772 ng/mL — ABNORMAL HIGH (ref 24–336)

## 2019-11-02 LAB — BRAIN NATRIURETIC PEPTIDE: B Natriuretic Peptide: 55.6 pg/mL (ref 0.0–100.0)

## 2019-11-02 LAB — FIBRINOGEN: Fibrinogen: 679 mg/dL — ABNORMAL HIGH (ref 210–475)

## 2019-11-02 LAB — C-REACTIVE PROTEIN
CRP: 3.9 mg/dL — ABNORMAL HIGH (ref <1.0)
CRP: 3 mg/dL — ABNORMAL HIGH (ref <1.0)

## 2019-11-02 NOTE — TOC Progression Note (Signed)
Transition of Care [facility]) - Progression Note    Patient Details  Name: Cesar Pratt MRN: [id number] Date of Birth: October 03, 1976  [facility] of [facility] CM/SW Contact  Purcell Mouton, RN Phone Number: 11/02/2019, 3:51 PM  Clinical Narrative:    Pt from home, with spouse and plan to return. Will follow for discharge needs.    Expected Discharge Plan: Home/Self Care Barriers to Discharge: No Barriers Identified  Expected Discharge Plan and Services Expected Discharge Plan: Home/Self Care       Living arrangements for the past 2 months: Single Family Home                                       Social Determinants of Health (SDOH) Interventions    Readmission Risk Interventions No flowsheet data found.

## 2019-11-02 NOTE — Progress Notes (Signed)
[facility]                                                                              Patient Demographics  Cesar Pratt, is a 44 y.o. male, DOB - 09-27-1976, [id number]  Admit date - 10/31/2019   Admitting Physician Kayleen Memos, DO  Outpatient Primary MD for the patient is Garwin Brothers, MD  Outpatient specialists:   LOS - 2  days   Medical records reviewed and are as summarized below:    Chief Complaint  Patient presents with  . 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  Patient also reported nonproductive cough, runny nose.  No GI symptoms.  Reported home pulse ox showed O2 sats dropping into mid 80s on room air and with minimal ambulation.  Patient had completed 7 days of oral Zithromax and 6 mg oral Decadron PTA.  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  He was incidentally found positive at his outpatient clinic while demonstrating to his staff how to use the testing kit. CT angiogram negative for PE however showed bilateral pulmonary infiltrates with groundglass appearance suggestive of COVID-19 viral pneumonia. Blood cultures 1/2+ for GPC COVID-19 test positive.  Patient was started on remdesivir in ED   Assessment & Plan    Active Problems:  Acute hypoxic respiratory failure due to acute COVID-19 viral pneumonia during the ongoing COVID-19 pandemic- POA - Patient presented with hypoxia, dyspnea on exertion.  CTA chest showed no acute PE, diffuse interstitial thickening with interspersed groundglass opacities consistent with COVID-19. - Currently hypoxic, requiring 2 L nasal cannula.  O2 sats dropped to 88 to 92% on conversing. - Continue current management: Decadron 6 mg IV daily, Remdesivir per pharmacy  protocol.  Remdesivir day #3 today  -Received convalescent plasma on 1/17 - Continue Supportive care: vitamin C/zinc, albuterol, Tylenol. - Continue to wean oxygen, ambulatory O2 screening daily as tolerated  - Oxygen - SpO2: 95 % O2 Flow Rate (L/min): 2 L/min(L/min) - Continue to follow labs as below.  CRP trending down  Recent Labs  Lab 10/31/19 1332 10/31/19 1333 11/01/19 2301 11/02/19 0518  DDIMER 0.69*  --  0.66* 0.55*  FERRITIN  --  651* 746* 772*  CRP  --  6.4* 3.9* 3.0*  ALT 76*  --  15* 71*  PROCALCITON <0.10  --   --   --     Blood cultures 1/2 GPC -Cultures reviewed, coagulase-negative staph, likely contaminant   Code Status: Full CODE STATUS DVT Prophylaxis:  Lovenox Family Communication: Discussed all imaging results, lab results, explained to the patient   Disposition Plan: Pending clinical status, currently inpatient given acute hypoxic respiratory failure, acute COVID-19 illness.  Once ambulatory without overt symptoms or hypoxia would consider discharge home.  Time Spent in minutes 25 minutes  Procedures:  CT angiogram chest  Consultants:   None  Antimicrobials:  Anti-infectives (From admission, onward)   Start     Dose/Rate Route Frequency Ordered Stop   11/01/19 1000  remdesivir 100 mg in sodium chloride 0.9 % 100 mL IVPB     100 mg 200 mL/hr over 30 Minutes Intravenous Daily 10/31/19 1520 11/05/19 0959   10/31/19 1530  remdesivir 100 mg in sodium chloride 0.9 % 100 mL IVPB     100 mg 200 mL/hr over 30 Minutes Intravenous Every 30 min 10/31/19 1520 10/31/19 1745          Medications  Scheduled Meds: . ascorbic acid  500 mg Oral Daily  . chlorpheniramine-HYDROcodone  5 mL Oral Q12H  . cholecalciferol  1,000 Units Oral Daily  . dexamethasone (DECADRON) injection  6 mg Intravenous Q24H  . enoxaparin (LOVENOX) injection  40 mg Subcutaneous Q24H  . ipratropium  2 puff Inhalation Q8H  . levalbuterol  2 puff Inhalation Q8H  . zinc sulfate   220 mg Oral Daily   Continuous Infusions: . sodium chloride 1,000 mL (11/02/19 1036)  . remdesivir 100 mg in NS 100 mL 100 mg (11/02/19 1030)   PRN Meds:.sodium chloride, acetaminophen, hydrALAZINE, ondansetron (ZOFRAN) IV      Subjective:   [name] was seen and examined today.  Overall starting to feel better.  No fevers or chills.  Still has shortness of breath, O2 sats drops to 88 to 92% during conversation.  No nausea vomiting or diarrhea.  No chest pain, new weakness numbness or tingling.  No acute events overnight.    Objective:   Vitals:   11/02/19 0458 11/02/19 0615 11/02/19 0900 11/02/19 1131  BP: (!) 137/102 (!) 134/97 118/74 138/90  Pulse: 67 (!) 55 68 65  Resp: '20 20 18 20  ' Temp: 98.4 F (36.9 C) 98.4 F (36.9 C) 98.2 F (36.8 C) 98.9 F (37.2 C)  TempSrc: Oral Oral Oral Oral  SpO2: 94% 94% 96% 95%  Weight:      Height:        Intake/Output Summary (Last 24 hours) at 11/02/2019 1346 Last data filed at 11/02/2019 1000 Gross per 24 hour  Intake 1400 ml  Output --  Net 1400 ml     Wt Readings from Last 3 Encounters:  10/31/19 90.7 kg     Exam  General: Alert and oriented x 3, NAD  Eyes:   HEENT:    Cardiovascular: S1 S2 auscultated, no murmurs, RRR  Respiratory: Clear to auscultation bilaterally, no wheezing, rales or rhonchi  Gastrointestinal: Soft, nontender, nondistended, + bowel sounds  Ext: no pedal edema bilaterally  Neuro: Moving all 4 extremities spontaneously  Musculoskeletal:   Skin: No rashes  Psych: Normal affect and demeanor, alert and oriented x3    Data Reviewed:  I have personally reviewed following labs and imaging studies  Micro Results Recent Results (from the past 240 hour(s))  SARS Coronavirus 2 Ag (30 min TAT) - Nasal Swab (BD Veritor Kit)     Status: Abnormal   Collection Time: 10/31/19  1:34 PM   Specimen: Nasal Swab (BD Veritor Kit)  Result Value Ref Range Status   SARS Coronavirus 2 Ag POSITIVE (A)  NEGATIVE Final    Comment: RESULT CALLED TO, READ BACK BY AND VERIFIED WITH: BENTON JOSS '@1458'  ON 10/31/2019, [email or web address] (NOTE) SARS-CoV-2 antigen PRESENT. Positive results indicate the presence of viral antigens, but clinical correlation with patient history and other diagnostic information is necessary to determine patient infection status.  Positive results do not rule out bacterial infection  or co-infection  with other viruses. False positive results are rare but can occur, and confirmatory RT-PCR testing may be appropriate in some circumstances. The expected result is Negative. Fact Sheet for Patients: PodPark.tn Fact Sheet for Providers: GiftContent.is  This test is not yet approved or cleared by the [place] FDA and  has been authorized for detection and/or diagnosis of SARS-CoV-2 by FDA under an Emergency Use Authorization (EUA).  This EUA will remain in effect (meaning this test can be used) for the duration of  the COVID-1 9 declaration under Section 564(b)(1) of the Act, 21 U.S.C. section 360bbb-3(b)(1), unless the authorization is terminated or revoked sooner. Performed at [facility], [address] [place], [place] [address]   Blood Culture (routine x 2)     Status: None (Preliminary result)   Collection Time: 10/31/19  1:36 PM   Specimen: Right Antecubital; Blood  Result Value Ref Range Status   Specimen Description   Final    RIGHT ANTECUBITAL Performed at [facility], [address] [place], [place] [address]    Special Requests   Final    BOTTLES DRAWN AEROBIC AND ANAEROBIC Blood Culture adequate volume Performed at [facility], [address] [place], [place] [address]    Culture   Final    NO GROWTH 2 DAYS Performed at [facility], [place] [address], [place], [place] [address]    Report Status PENDING  Incomplete  Blood Culture (routine x 2)      Status: Abnormal   Collection Time: 10/31/19  1:51 PM   Specimen: Left Antecubital; Blood  Result Value Ref Range Status   Specimen Description   Final    LEFT ANTECUBITAL Performed at [facility], [address] [place], [place] [address]    Special Requests   Final    BOTTLES DRAWN AEROBIC AND ANAEROBIC Blood Culture adequate volume Performed at [facility], [address] [place], [place] [address]    Culture  Setup Time   Final    GRAM POSITIVE COCCI AEROBIC BOTTLE ONLY CRITICAL RESULT CALLED TO, READ BACK BY AND VERIFIED WITH: [facility] CHARGE SOPHIE '@1531'  11/01/19 AKT    Culture (A)  Final    STAPHYLOCOCCUS SPECIES (COAGULASE NEGATIVE) THE SIGNIFICANCE OF ISOLATING THIS ORGANISM FROM A SINGLE SET OF BLOOD CULTURES WHEN MULTIPLE SETS ARE DRAWN IS UNCERTAIN. PLEASE NOTIFY THE MICROBIOLOGY DEPARTMENT WITHIN ONE WEEK IF SPECIATION AND SENSITIVITIES ARE REQUIRED. Performed at [facility], [place] [address] [place], [place] [address]    Report Status 11/02/2019 FINAL  Final    Radiology Reports CT Angio Chest PE W and/or Wo Contrast  Result Date: 11/01/2019 CLINICAL DATA:  Positive D-dimer. Concern for pulmonary embolism. COVID positive. Increasing short of breath. EXAM: CT ANGIOGRAPHY CHEST WITH CONTRAST TECHNIQUE: Multidetector CT imaging of the chest was performed using the standard protocol during bolus administration of intravenous contrast. Multiplanar CT image reconstructions and MIPs were obtained to evaluate the vascular anatomy. CONTRAST:  55m OMNIPAQUE IOHEXOL 350 MG/ML SOLN COMPARISON:  Chest radiograph 10/31/2019 FINDINGS: Cardiovascular: No filling defects within the pulmonary arteries to suggest acute pulmonary embolism. No acute findings of the aorta or great vessels. No pericardial fluid. Mediastinum/Nodes: No axillary supraclavicular adenopathy. No mediastinal hilar adenopathy. No pericardial effusion. Esophagus normal. Lungs/Pleura: Linear  interstitial thickening within the upper lobes and lower lobes. There is scattered ground-glass densities within the interstitial thickening primarily in  the upper lobes. No pleural fluid. No pneumothorax Upper Abdomen: Limited view of the liver, kidneys, pancreas are unremarkable. Normal adrenal glands. Musculoskeletal: No aggressive osseous lesion. Review of the MIP images confirms the above findings. IMPRESSION: 1. No acute pulmonary embolism. 2. Diffuse interstitial thickening with interspersed ground-glass opacities consistent with COVID viral infection. Electronically Signed   By: Suzy Bouchard M.D.   On: 11/01/2019 09:52   DG Chest Port 1 View  Result Date: 10/31/2019 CLINICAL DATA:  COVID positive, shortness of breath EXAM: PORTABLE CHEST 1 VIEW COMPARISON:  None. FINDINGS: Low lung volumes with minor lower lung bandlike opacities more compatible with atelectasis. Normal heart size and vascularity. Negative for edema, effusion or pneumothorax. Trachea midline. IMPRESSION: Low volume exam with bibasilar atelectasis. Electronically Signed   By: Jerilynn Mages.  Shick M.D.   On: 10/31/2019 13:12    Lab Data:  CBC: Recent Labs  Lab 10/31/19 1332 11/01/19 2301 11/02/19 0518  WBC 9.5 9.1 6.2  NEUTROABS 7.4 7.9* 4.9  HGB 17.3* 16.9 16.6  HCT 50.0 48.1 49.5  MCV 87.1 87.3 88.9  PLT 214 205 115   Basic Metabolic Panel: Recent Labs  Lab 10/31/19 1332 11/01/19 2301 11/02/19 0518  NA 138 138 139  K 3.7 4.0 4.3  CL 106 107 109  CO2 '22 22 22  ' GLUCOSE 84 220* 156*  BUN 28* 31* 32*  CREATININE 1.07 1.12 0.92  CALCIUM 8.6* 8.3* 8.5*   GFR: Estimated Creatinine Clearance: 110.9 mL/min (by C-G formula based on SCr of 0.92 mg/dL). Liver Function Tests: Recent Labs  Lab 10/31/19 1332 11/01/19 2301 11/02/19 0518  AST 33 30 28  ALT 76* 68* 71*  ALKPHOS 56 60 61  BILITOT 0.7 0.4 0.6  PROT 7.5 7.0 7.2  ALBUMIN 3.4* 3.1* 3.2*   No results for input(s): LIPASE, AMYLASE in the last 168  hours. No results for input(s): AMMONIA in the last 168 hours. Coagulation Profile: No results for input(s): INR, PROTIME in the last 168 hours. Cardiac Enzymes: No results for input(s): CKTOTAL, CKMB, CKMBINDEX, TROPONINI in the last 168 hours. BNP (last 3 results) No results for input(s): PROBNP in the last 8760 hours. HbA1C: No results for input(s): HGBA1C in the last 72 hours. CBG: No results for input(s): GLUCAP in the last 168 hours. Lipid Profile: Recent Labs    10/31/19 1332  TRIG 70   Thyroid Function Tests: No results for input(s): TSH, T4TOTAL, FREET4, T3FREE, THYROIDAB in the last 72 hours. Anemia Panel: Recent Labs    11/01/19 2301 11/02/19 0518  FERRITIN 746* 772*   Urine analysis: No results found for: COLORURINE, APPEARANCEUR, LABSPEC, PHURINE, GLUCOSEU, HGBUR, BILIRUBINUR, KETONESUR, PROTEINUR, UROBILINOGEN, NITRITE, LEUKOCYTESUR   Exodus Kutzer M.D. [facility] 11/02/2019, 1:46 PM   Call night coverage person covering after 7pm

## 2019-11-02 NOTE — Progress Notes (Addendum)
Blood Bank called that plasma is ready but unable to transfuse plasma at this time. Patient has no Blood bank bracelet. Oncall informed and waiting for orders. Will continue to monitor.

## 2019-11-02 NOTE — Evaluation (Signed)
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  Patient also reported nonproductive cough, runny nose.  Clinical Impression  Pt at MOD I level with mobility in room. His o2 sat did drop to 89-90% on room air.  Pt has been getting up in room and no balance issues with gait.  Will d/c from PT at this time and pt in agreement. He is aware that if his status changes PT can be re-ordered.    Follow Up Recommendations No PT follow up    Equipment Recommendations  None recommended by PT    Recommendations for Other Services       Precautions / Restrictions Precautions Precautions: None      Mobility  Bed Mobility               General bed mobility comments: NT- has been in recliner since yesterday  Transfers Overall transfer level: Modified independent Equipment used: None             General transfer comment: MOD I with standing  Ambulation/Gait Ambulation/Gait assistance: Modified independent (Device/Increase time) Gait Distance (Feet): 100 Feet Assistive device: None Gait Pattern/deviations: Step-through pattern     General Gait Details: Amb laps in room without AD on RA with o2 sat dropping to 89-90%. Pt able to do quick turns and negiotiate tight spaces without difficulty.  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, newspaper        Extremity/Trunk Assessment   Upper Extremity Assessment Upper Extremity Assessment: Overall WFL for tasks assessed    Lower Extremity Assessment Lower Extremity Assessment: Overall WFL for tasks assessed    Cervical / Trunk Assessment Cervical / Trunk Assessment: Normal  Communication   Communication: No difficulties  Cognition Arousal/Alertness: Awake/alert Behavior During Therapy: WFL for tasks assessed/performed Overall Cognitive Status: Within Functional Limits for tasks assessed                                        General Comments      Exercises     Assessment/Plan    PT Assessment Patent does not need any further PT services  PT Problem List         PT Treatment Interventions      PT Goals (Current goals can be found  in the Care Plan section)  Acute Rehab PT Goals Patient Stated Goal: home PT Goal Formulation: All assessment and education complete, DC therapy    Frequency     Barriers to discharge        Co-evaluation               AM-PAC PT "6 Clicks" Mobility  Outcome Measure Help needed turning from your back to your side while in a flat bed without using bedrails?: None Help needed moving from lying on your back to sitting on the side of a flat bed without using bedrails?: None Help needed moving to and from a bed to a chair (including a wheelchair)?: None Help needed standing up from a chair using your arms (e.g., wheelchair or bedside chair)?: None Help needed to walk in hospital room?: None Help needed climbing 3-5 steps with a railing? : None 6 Click Score: 24    End of Session   Activity Tolerance: Patient tolerated treatment well Patient left: in chair;with call bell/phone within reach;with  nursing/sitter in room Nurse Communication: Mobility status PT Visit Diagnosis: Difficulty in walking, not elsewhere classified (R26.2)    Time: [id number] PT Time Calculation (min) (ACUTE ONLY): 19 min   Charges:   PT Evaluation $PT Eval Low Complexity: 1 Low          Stormie Ventola L. Tamala Julian, [place] Pager [phone] 11/02/2019   Galen Manila 11/02/2019, 3:17 PM

## 2019-11-03 ENCOUNTER — Inpatient Hospital Stay (HOSPITAL_COMMUNITY): Payer: BC Managed Care – PPO

## 2019-11-03 DIAGNOSIS — J069 Acute upper respiratory infection, unspecified: Secondary | ICD-10-CM

## 2019-11-03 LAB — COMPREHENSIVE METABOLIC PANEL
ALT: 65 U/L — ABNORMAL HIGH (ref 0–44)
AST: 27 U/L (ref 15–41)
Albumin: 3.1 g/dL — ABNORMAL LOW (ref 3.5–5.0)
Alkaline Phosphatase: 59 U/L (ref 38–126)
Anion gap: 7 (ref 5–15)
BUN: 31 mg/dL — ABNORMAL HIGH (ref 6–20)
CO2: 25 mmol/L (ref 22–32)
Calcium: 8.5 mg/dL — ABNORMAL LOW (ref 8.9–10.3)
Chloride: 107 mmol/L (ref 98–111)
Creatinine, Ser: 0.9 mg/dL (ref 0.61–1.24)
GFR calc Af Amer: >60 mL/min (ref >60)
GFR calc non Af Amer: >60 mL/min (ref >60)
Glucose, Bld: 144 mg/dL — ABNORMAL HIGH (ref 70–99)
Potassium: 4.3 mmol/L (ref 3.5–5.1)
Sodium: 139 mmol/L (ref 135–145)
Total Bilirubin: 0.5 mg/dL (ref 0.3–1.2)
Total Protein: 6.8 g/dL (ref 6.5–8.1)

## 2019-11-03 LAB — CBC WITH DIFFERENTIAL/PLATELET
Abs Immature Granulocytes: 0.37 10*3/uL — ABNORMAL HIGH (ref 0.00–0.07)
Basophils Absolute: 0 10*3/uL (ref 0.0–0.1)
Basophils Relative: 0 %
Eosinophils Absolute: 0 10*3/uL (ref 0.0–0.5)
Eosinophils Relative: 0 %
HCT: 47.4 % (ref 39.0–52.0)
Hemoglobin: 16 g/dL (ref 13.0–17.0)
Immature Granulocytes: 4 %
Lymphocytes Relative: 7 %
Lymphs Abs: 0.7 10*3/uL (ref 0.7–4.0)
MCH: 30 pg (ref 26.0–34.0)
MCHC: 33.8 g/dL (ref 30.0–36.0)
MCV: 88.9 fL (ref 80.0–100.0)
Monocytes Absolute: 0.5 10*3/uL (ref 0.1–1.0)
Monocytes Relative: 5 %
Neutro Abs: 8.2 10*3/uL — ABNORMAL HIGH (ref 1.7–7.7)
Neutrophils Relative %: 84 %
Platelets: 247 10*3/uL (ref 150–400)
RBC: 5.33 MIL/uL (ref 4.22–5.81)
RDW: 12.7 % (ref 11.5–15.5)
WBC: 9.9 10*3/uL (ref 4.0–10.5)
nRBC: 0 % (ref 0.0–0.2)

## 2019-11-03 LAB — BPAM FFP
Blood Product Expiration Date: 202101190351
ISSUE DATE / TIME: 202101180559
Unit Type and Rh: 5100

## 2019-11-03 LAB — D-DIMER, QUANTITATIVE: D-Dimer, Quant: 0.44 ug/mL-FEU (ref 0.00–0.50)

## 2019-11-03 LAB — PREPARE FRESH FROZEN PLASMA

## 2019-11-03 LAB — C-REACTIVE PROTEIN: CRP: 1.2 mg/dL — ABNORMAL HIGH (ref <1.0)

## 2019-11-03 LAB — FIBRINOGEN: Fibrinogen: 592 mg/dL — ABNORMAL HIGH (ref 210–475)

## 2019-11-03 LAB — FERRITIN: Ferritin: 592 ng/mL — ABNORMAL HIGH (ref 24–336)

## 2019-11-03 LAB — LACTATE DEHYDROGENASE: LDH: 170 U/L (ref 98–192)

## 2019-11-03 MED ORDER — IPRATROPIUM BROMIDE HFA 17 MCG/ACT IN AERS
2.0000 | INHALATION_SPRAY | Freq: Four times a day (QID) | RESPIRATORY_TRACT | Status: DC | PRN
  Administered 2019-11-04: 2 via RESPIRATORY_TRACT

## 2019-11-03 MED ORDER — LEVALBUTEROL TARTRATE 45 MCG/ACT IN AERO: 2.0000 | INHALATION_SPRAY | Freq: Four times a day (QID) | RESPIRATORY_TRACT | Status: DC | PRN

## 2019-11-03 MED ORDER — LEVALBUTEROL TARTRATE 45 MCG/ACT IN AERO
2.0000 | INHALATION_SPRAY | Freq: Two times a day (BID) | RESPIRATORY_TRACT | Status: DC
  Administered 2019-11-04: 2 via RESPIRATORY_TRACT

## 2019-11-03 NOTE — Progress Notes (Signed)
Pt's previous BP 143/102 earlier this AM. RN with morning assessment wanted to recheck Pt's BP. Pt declined recheck of BP at this time

## 2019-11-03 NOTE — Progress Notes (Signed)
[facility]                                                                              Patient Demographics  Cesar Pratt, is a 44 y.o. male, DOB - 1975/12/23, UPB:[id number]  Admit date - 10/31/2019   Admitting Physician Kayleen Memos, DO  Outpatient Primary MD for the patient is Garwin Brothers, MD  Outpatient specialists:   LOS - 3  days   Medical records reviewed and are as summarized below:    Chief Complaint  Patient presents with  . 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  Patient also reported nonproductive cough, runny nose.  No GI symptoms.  Reported home pulse ox showed O2 sats dropping into mid 80s on room air and with minimal ambulation.  Patient had completed 7 days of oral Zithromax and 6 mg oral Decadron PTA.  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  He was incidentally found positive at his outpatient clinic while demonstrating to his staff how to use the testing kit. CT angiogram negative for PE however showed bilateral pulmonary infiltrates with groundglass appearance suggestive of COVID-19 viral pneumonia. Blood cultures 1/2+ for GPC COVID-19 test positive.  Patient was started on remdesivir in ED   Assessment & Plan    Active Problems:  Acute hypoxic respiratory failure due to acute COVID-19 viral pneumonia during the ongoing COVID-19 pandemic- POA - Patient presented with hypoxia, dyspnea on exertion.  CTA chest showed no acute PE, diffuse interstitial thickening with interspersed groundglass opacities consistent with COVID-19. -Still hypoxic, requiring 2 L O2 via nasal cannula.  PT eval done on 1/18 with O2 sats 89 to 90% on room air with ambulation. - Continue current management: Decadron 6 mg IV daily,  Remdesivir per pharmacy protocol.  Remdesivir day #4 today.  Patient requested to complete remdesivir protocol while inpatient.  -Received convalescent plasma on 1/17 - Continue Supportive care: vitamin C/zinc, albuterol, Tylenol. - Continue to wean oxygen, ambulatory O2 screening daily as tolerated  - Oxygen - SpO2: 93 % O2 Flow Rate (L/min): 2 L/min(L/min).  Will likely need home O2 evaluation again in a.m. prior to discharge. - Continue to follow labs as below.  Inflammatory markers improving.  Repeat chest x-ray with the similar mild interstitial prominence reflecting COVID-19 infection  Recent Labs  Lab 10/31/19 1332 10/31/19 1333 11/01/19 2301 11/02/19 0518 11/03/19 0250  DDIMER 0.69*  --  0.66* 0.55* 0.44  FERRITIN  --  651* 746* 772* 592*  CRP  --  6.4* 3.9* 3.0* 1.2*  ALT 76*  --  68* 71* 65*  PROCALCITON <0.10  --   --   --   --     Blood cultures 1/2 GPC -Cultures reviewed, coagulase-negative staph, likely contaminant   Code Status: Full CODE STATUS DVT Prophylaxis:  Lovenox Family Communication: Discussed all imaging results, lab results, explained to  the patient   Disposition Plan: Pending clinical status, currently inpatient given acute hypoxic respiratory failure, acute COVID-19 illness.    Still somewhat hypoxic, will continue remdesivir dosing, day #5 tomorrow.  If no acute issues, likely DC home in a.m.  Time Spent in minutes 25 minutes  Procedures:  CT angiogram chest  Consultants:   None  Antimicrobials:   Anti-infectives (From admission, onward)   Start     Dose/Rate Route Frequency Ordered Stop   11/01/19 1000  remdesivir 100 mg in sodium chloride 0.9 % 100 mL IVPB     100 mg 200 mL/hr over 30 Minutes Intravenous Daily 10/31/19 1520 11/05/19 0959   10/31/19 1530  remdesivir 100 mg in sodium chloride 0.9 % 100 mL IVPB     100 mg 200 mL/hr over 30 Minutes Intravenous Every 30 min 10/31/19 1520 10/31/19 1745         Medications  Scheduled  Meds: . ascorbic acid  500 mg Oral Daily  . chlorpheniramine-HYDROcodone  5 mL Oral Q12H  . cholecalciferol  1,000 Units Oral Daily  . dexamethasone (DECADRON) injection  6 mg Intravenous Q24H  . enoxaparin (LOVENOX) injection  40 mg Subcutaneous Q24H  . ipratropium  2 puff Inhalation Q8H  . levalbuterol  2 puff Inhalation Q8H  . zinc sulfate  220 mg Oral Daily   Continuous Infusions: . sodium chloride 1,000 mL (11/02/19 1036)  . remdesivir 100 mg in NS 100 mL 100 mg (11/03/19 0904)   PRN Meds:.sodium chloride, acetaminophen, hydrALAZINE, ondansetron (ZOFRAN) IV      Subjective:   Arshawn Valdez was seen and examined today.  Feels a lot better, still somewhat short of breath, borderline O2 on 2 L via nasal cannula.  No nausea vomiting or diarrhea.   No chest pain, new weakness numbness or tingling.  No acute events overnight.    Objective:   Vitals:   11/03/19 1157 11/03/19 1158 11/03/19 1337 11/03/19 1416  BP:  (!) 168/92 139/89 (!) 157/88  Pulse:   69 73  Resp:   18 18  Temp:   98.8 F (37.1 C) 99 F (37.2 C)  TempSrc:   Oral Oral  SpO2: 94%  92% 93%  Weight:      Height:        Intake/Output Summary (Last 24 hours) at 11/03/2019 1544 Last data filed at 11/03/2019 1347 Gross per 24 hour  Intake 480 ml  Output --  Net 480 ml     Wt Readings from Last 3 Encounters:  10/31/19 90.7 kg   Physical Exam  General: Alert and oriented x 3, NAD  Eyes:  HEENT:  Cardiovascular: S1 S2 clear,  RRR. No pedal edema b/l  Respiratory: CTAB, no wheezing, rales or rhonchi  Gastrointestinal: Soft, nontender, nondistended, NBS  Ext: no pedal edema bilaterally  Neuro: no new deficits  Musculoskeletal:   Skin: No rashes  Psych: Normal affect and demeanor, alert and oriented x3    Data Reviewed:  I have personally reviewed following labs and imaging studies  Micro Results Recent Results (from the past 240 hour(s))  SARS Coronavirus 2 Ag (30 min TAT) - Nasal Swab  (BD Veritor Kit)     Status: Abnormal   Collection Time: 10/31/19  1:34 PM   Specimen: Nasal Swab (BD Veritor Kit)  Result Value Ref Range Status   SARS Coronavirus 2 Ag POSITIVE (A) NEGATIVE Final    Comment: RESULT CALLED TO, READ BACK BY AND VERIFIED WITH: BENTON JOSS '@[id number]'   ON 10/31/2019, [email or web address] (NOTE) SARS-CoV-2 antigen PRESENT. Positive results indicate the presence of viral antigens, but clinical correlation with patient history and other diagnostic information is necessary to determine patient infection status.  Positive results do not rule out bacterial infection or co-infection  with other viruses. False positive results are rare but can occur, and confirmatory RT-PCR testing may be appropriate in some circumstances. The expected result is Negative. Fact Sheet for Patients: PodPark.tn Fact Sheet for Providers: GiftContent.is  This test is not yet approved or cleared by the [place] FDA and  has been authorized for detection and/or diagnosis of SARS-CoV-2 by FDA under an Emergency Use Authorization (EUA).  This EUA will remain in effect (meaning this test can be used) for the duration of  the COVID-1 9 declaration under Section 564(b)(1) of the Act, 21 U.S.C. section 360bbb-3(b)(1), unless the authorization is terminated or revoked sooner. Performed at [facility], [address] [place], [place] [address]   Blood Culture (routine x 2)     Status: None (Preliminary result)   Collection Time: 10/31/19  1:36 PM   Specimen: Right Antecubital; Blood  Result Value Ref Range Status   Specimen Description   Final    RIGHT ANTECUBITAL Performed at [facility], [address] [place], [place] [address]    Special Requests   Final    BOTTLES DRAWN AEROBIC AND ANAEROBIC Blood Culture adequate volume Performed at [facility], [address] [place], [place] [address]     Culture   Final    NO GROWTH 3 DAYS Performed at [facility], [place] [address], [place], [place] [address]    Report Status PENDING  Incomplete  Blood Culture (routine x 2)     Status: Abnormal   Collection Time: 10/31/19  1:51 PM   Specimen: Left Antecubital; Blood  Result Value Ref Range Status   Specimen Description   Final    LEFT ANTECUBITAL Performed at [facility], [address] [place], [place] [address]    Special Requests   Final    BOTTLES DRAWN AEROBIC AND ANAEROBIC Blood Culture adequate volume Performed at [facility], [address] [place], [place] [address]    Culture  Setup Time   Final    GRAM POSITIVE COCCI AEROBIC BOTTLE ONLY CRITICAL RESULT CALLED TO, READ BACK BY AND VERIFIED WITH: [facility] CHARGE SOPHIE '@1531'  11/01/19 AKT    Culture (A)  Final    STAPHYLOCOCCUS SPECIES (COAGULASE NEGATIVE) THE SIGNIFICANCE OF ISOLATING THIS ORGANISM FROM A SINGLE SET OF BLOOD CULTURES WHEN MULTIPLE SETS ARE DRAWN IS UNCERTAIN. PLEASE NOTIFY THE MICROBIOLOGY DEPARTMENT WITHIN ONE WEEK IF SPECIATION AND SENSITIVITIES ARE REQUIRED. Performed at [facility], [place] [address] [place], [place] [address]    Report Status 11/02/2019 FINAL  Final    Radiology Reports CT Angio Chest PE W and/or Wo Contrast  Result Date: 11/01/2019 CLINICAL DATA:  Positive D-dimer. Concern for pulmonary embolism. COVID positive. Increasing short of breath. EXAM: CT ANGIOGRAPHY CHEST WITH CONTRAST TECHNIQUE: Multidetector CT imaging of the chest was performed using the standard protocol during bolus administration of intravenous contrast. Multiplanar CT image reconstructions and MIPs were obtained to evaluate the vascular anatomy. CONTRAST:  44m OMNIPAQUE IOHEXOL 350 MG/ML SOLN COMPARISON:  Chest radiograph 10/31/2019 FINDINGS: Cardiovascular: No filling defects within the pulmonary arteries to suggest acute pulmonary embolism. No acute findings of the aorta or great  vessels. No pericardial fluid. Mediastinum/Nodes: No axillary supraclavicular adenopathy. No mediastinal hilar adenopathy. No pericardial effusion. Esophagus normal. Lungs/Pleura: Linear interstitial thickening within the upper lobes and lower lobes. There is scattered ground-glass densities within the interstitial thickening primarily in the upper lobes. No pleural fluid. No pneumothorax Upper Abdomen: Limited view of the liver, kidneys, pancreas are unremarkable. Normal adrenal glands. Musculoskeletal: No aggressive osseous lesion. Review of the MIP images confirms the above findings. IMPRESSION: 1. No acute pulmonary embolism. 2. Diffuse interstitial thickening with interspersed ground-glass opacities consistent with COVID viral infection. Electronically Signed   By: Suzy Bouchard M.D.   On: 11/01/2019 09:52   DG Chest Port 1 View  Result Date: 11/03/2019 CLINICAL DATA:  Shortness of breath, COVID-19 positive EXAM: PORTABLE CHEST 1 VIEW COMPARISON:  10/31/2019 FINDINGS: Similar mild interstitial prominence, greater at the lung bases. No pleural effusion or pneumothorax. Stable cardiomediastinal contours. IMPRESSION: Similar mild interstitial prominence likely reflecting COVID-19 infection. Electronically Signed   By: Macy Mis M.D.   On: 11/03/2019 10:41   DG Chest Port 1 View  Result Date: 10/31/2019 CLINICAL DATA:  COVID positive, shortness of breath EXAM: PORTABLE CHEST 1 VIEW COMPARISON:  None. FINDINGS: Low lung volumes with minor lower lung bandlike opacities more compatible with atelectasis. Normal heart size and vascularity. Negative for edema, effusion or pneumothorax. Trachea midline. IMPRESSION: Low volume exam with bibasilar atelectasis. Electronically Signed   By: Jerilynn Mages.  Shick M.D.   On: 10/31/2019 13:12    Lab Data:  CBC: Recent Labs  Lab 10/31/19 1332 11/01/19 2301 11/02/19 0518 11/03/19 0250  WBC 9.5 9.1 6.2 9.9  NEUTROABS 7.4 7.9* 4.9 8.2*  HGB 17.3* 16.9 16.6 16.0    HCT 50.0 48.1 49.5 47.4  MCV 87.1 87.3 88.9 88.9  PLT 214 205 222 035   Basic Metabolic Panel: Recent Labs  Lab 10/31/19 1332 11/01/19 2301 11/02/19 0518 11/03/19 0250  NA 138 138 139 139  K 3.7 4.0 4.3 4.3  CL 106 107 109 107  CO2 '22 22 22 25  ' GLUCOSE 84 220* 156* 144*  BUN 28* 31* 32* 31*  CREATININE 1.07 1.12 0.92 0.90  CALCIUM 8.6* 8.3* 8.5* 8.5*   GFR: Estimated Creatinine Clearance: 113.3 mL/min (by C-G formula based on SCr of 0.9 mg/dL). Liver Function Tests: Recent Labs  Lab 10/31/19 1332 11/01/19 2301 11/02/19 0518 11/03/19 0250  AST 33 '30 28 27  ' ALT 76* 68* 71* 65*  ALKPHOS 56 60 61 59  BILITOT 0.7 0.4 0.6 0.5  PROT 7.5 7.0 7.2 6.8  ALBUMIN 3.4* 3.1* 3.2* 3.1*   No results for input(s): LIPASE, AMYLASE in the last 168 hours. No results for input(s): AMMONIA in the last 168 hours. Coagulation Profile: No results for input(s): INR, PROTIME in the last 168 hours. Cardiac Enzymes: No results for input(s): CKTOTAL, CKMB, CKMBINDEX, TROPONINI in the last 168 hours. BNP (last 3 results) No results for input(s): PROBNP in the last 8760 hours. HbA1C: No results for input(s): HGBA1C in the last 72 hours. CBG: No results for input(s): GLUCAP in the last 168 hours. Lipid Profile: No results for input(s): CHOL, HDL, LDLCALC, TRIG, CHOLHDL, LDLDIRECT in the last 72 hours. Thyroid Function Tests: No results for input(s): TSH, T4TOTAL, FREET4, T3FREE, THYROIDAB in the last 72 hours. Anemia Panel: Recent Labs    11/02/19 0518 11/03/19 0250  FERRITIN 772* 592*   Urine analysis: No results found for: COLORURINE, APPEARANCEUR, LABSPEC, [place], GLUCOSEU, [place], BILIRUBINUR, KETONESUR, PROTEINUR, UROBILINOGEN, NITRITE, LEUKOCYTESUR  Estill Cotta M.D. [facility] 11/03/2019, 3:44 PM   Call night coverage person covering after 7pm

## 2019-11-04 LAB — COMPREHENSIVE METABOLIC PANEL
ALT: 64 U/L — ABNORMAL HIGH (ref 0–44)
AST: 27 U/L (ref 15–41)
Albumin: 2.8 g/dL — ABNORMAL LOW (ref 3.5–5.0)
Alkaline Phosphatase: 61 U/L (ref 38–126)
Anion gap: 6 (ref 5–15)
BUN: 31 mg/dL — ABNORMAL HIGH (ref 6–20)
CO2: 25 mmol/L (ref 22–32)
Calcium: 8.2 mg/dL — ABNORMAL LOW (ref 8.9–10.3)
Chloride: 106 mmol/L (ref 98–111)
Creatinine, Ser: 1.1 mg/dL (ref 0.61–1.24)
GFR calc Af Amer: >60 mL/min (ref >60)
GFR calc non Af Amer: >60 mL/min (ref >60)
Glucose, Bld: 163 mg/dL — ABNORMAL HIGH (ref 70–99)
Potassium: 4.4 mmol/L (ref 3.5–5.1)
Sodium: 137 mmol/L (ref 135–145)
Total Bilirubin: 0.6 mg/dL (ref 0.3–1.2)
Total Protein: 6.1 g/dL — ABNORMAL LOW (ref 6.5–8.1)

## 2019-11-04 LAB — CBC WITH DIFFERENTIAL/PLATELET
Abs Immature Granulocytes: 0.45 10*3/uL — ABNORMAL HIGH (ref 0.00–0.07)
Basophils Absolute: 0.1 10*3/uL (ref 0.0–0.1)
Basophils Relative: 1 %
Eosinophils Absolute: 0 10*3/uL (ref 0.0–0.5)
Eosinophils Relative: 0 %
HCT: 46.8 % (ref 39.0–52.0)
Hemoglobin: 15.8 g/dL (ref 13.0–17.0)
Immature Granulocytes: 4 %
Lymphocytes Relative: 7 %
Lymphs Abs: 0.8 10*3/uL (ref 0.7–4.0)
MCH: 30.5 pg (ref 26.0–34.0)
MCHC: 33.8 g/dL (ref 30.0–36.0)
MCV: 90.3 fL (ref 80.0–100.0)
Monocytes Absolute: 0.5 10*3/uL (ref 0.1–1.0)
Monocytes Relative: 5 %
Neutro Abs: 9.3 10*3/uL — ABNORMAL HIGH (ref 1.7–7.7)
Neutrophils Relative %: 83 %
Platelets: 262 10*3/uL (ref 150–400)
RBC: 5.18 MIL/uL (ref 4.22–5.81)
RDW: 12.4 % (ref 11.5–15.5)
WBC: 11.1 10*3/uL — ABNORMAL HIGH (ref 4.0–10.5)
nRBC: 0 % (ref 0.0–0.2)

## 2019-11-04 LAB — D-DIMER, QUANTITATIVE: D-Dimer, Quant: 0.36 ug/mL-FEU (ref 0.00–0.50)

## 2019-11-04 LAB — FERRITIN: Ferritin: 472 ng/mL — ABNORMAL HIGH (ref 24–336)

## 2019-11-04 LAB — FIBRINOGEN: Fibrinogen: 508 mg/dL — ABNORMAL HIGH (ref 210–475)

## 2019-11-04 LAB — C-REACTIVE PROTEIN: CRP: 0.8 mg/dL (ref <1.0)

## 2019-11-04 LAB — LACTATE DEHYDROGENASE: LDH: 201 U/L — ABNORMAL HIGH (ref 98–192)

## 2019-11-04 MED ORDER — LEVALBUTEROL TARTRATE 45 MCG/ACT IN AERO: 2.0000 | INHALATION_SPRAY | Freq: Three times a day (TID) | RESPIRATORY_TRACT | 12 refills | Status: DC | PRN

## 2019-11-04 MED ORDER — IPRATROPIUM BROMIDE HFA 17 MCG/ACT IN AERS: 2.0000 | INHALATION_SPRAY | Freq: Four times a day (QID) | RESPIRATORY_TRACT | 0 refills | Status: DC | PRN

## 2019-11-04 MED ORDER — HYDROCOD POLST-CPM POLST ER 10-8 MG/5ML PO SUER: 5.0000 mL | Freq: Two times a day (BID) | ORAL | 0 refills | Status: DC

## 2019-11-04 MED ORDER — VITAMIN D3 25 MCG PO TABS: 1000.0000 [IU] | ORAL_TABLET | Freq: Every day | ORAL | 0 refills | Status: DC

## 2019-11-04 MED ORDER — ASCORBIC ACID 500 MG PO TABS: 500.0000 mg | ORAL_TABLET | Freq: Every day | ORAL | 0 refills | Status: DC

## 2019-11-04 MED ORDER — ZINC SULFATE 220 (50 ZN) MG PO CAPS: 220.0000 mg | ORAL_CAPSULE | Freq: Every day | ORAL | 0 refills | Status: DC

## 2019-11-04 NOTE — TOC Progression Note (Addendum)
Transition of Care [facility]) - Progression Note    Patient Details  Name: Cesar Pratt MRN: [id number] Date of Birth: [birth year]  [facility] CM/SW Contact  Purcell Mouton, RN Phone Number: 11/04/2019, 10:50 AM  Clinical Narrative:    O2 from [place]. O2 will be delivered to nurses station.    Expected Discharge Plan: Home/Self Care Barriers to Discharge: No Barriers Identified  Expected Discharge Plan and Services Expected Discharge Plan: Home/Self Care       Living arrangements for the past 2 months: Single Family Home Expected Discharge Date: 11/04/19                                     Social Determinants of Health (SDOH) Interventions    Readmission Risk Interventions No flowsheet data found.

## 2019-11-04 NOTE — Plan of Care (Signed)
Discharge instructions reviewed with patient, questions answered, verbalized understanding.  Patient leaving with oxygen tank.  Awaiting ride for discharge at 11:30.

## 2019-11-04 NOTE — Progress Notes (Signed)
SATURATION QUALIFICATIONS: (This note is used to comply with regulatory documentation for home oxygen)  Patient Saturations on Room Air at Rest = 92%  Patient Saturations on Room Air while Ambulating = 87%  Patient Saturations on 2 Liters of oxygen while Ambulating = 94%  Please briefly explain why patient needs home oxygen: desats with ambulation

## 2019-11-04 NOTE — Discharge Summary (Signed)
Physician Discharge Summary  ICHOLAS IRBY Pratt:[id number] DOB: 1976/07/21 DOA: 10/31/2019  PCP: Garwin Brothers, MD  Admit date: 10/31/2019 Discharge date: 11/04/2019  Admitted From: home  Disposition:  home   Recommendations for Outpatient Follow-up:  1. 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  Patient also reported nonproductive cough, runny nose.  No GI symptoms.  Reported home pulse ox showed O2 sats dropping into mid 80s on room air and with minimal ambulation.  Patient had completed 7 days of oral Zithromax and 6 mg oral Decadron PTA.  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  He was incidentally found positive at his outpatient clinic while demonstrating to his staff how to use the testing kit. CT angiogram negative for PE however showed bilateral pulmonary infiltrates with groundglass appearance suggestive of COVID-19 viral pneumonia. Blood cultures 1/2+ for GPC COVID-19 test positive.  Patient was started on remdesivir in ED  Hospital Course:  Acute hypoxic respiratory failure due to COVID 19 pneumonia- POA - CT revealed b/l infiltrates suggestive of pneumonia and he has been hypoxic with dyspnea on exertion - He has been treated with 5 days of Decadron and Remdesivir along with Convalescent plasma on 1/17  - Inflammatory markers have been improving - He will be discharged home on 2 L Of oxygen as he remains hypoxic  1 set of blood cultures + for GPC - found to be  coagulase negative staph and thus likely a contaminant   Discharge Exam: Vitals:   11/03/19 1956 11/04/19 0644  BP: (!) 149/89 123/80  Pulse: 77 60  Resp: 20 20  Temp: 99.2 F (37.3 C) 98.4 F (36.9 C)  SpO2: 91% 95%   Vitals:   11/03/19 1337 11/03/19 1416 11/03/19 1956 11/04/19 0644  BP: 139/89 (!) 157/88 (!) 149/89 123/80  Pulse: 69 73 77 60  Resp: '18 18 20 20  ' Temp: 98.8 F (37.1 C) 99 F (37.2 C) 99.2 F (37.3 C) 98.4 F (36.9 C)  TempSrc: Oral Oral Oral Oral  SpO2: 92% 93% 91% 95%  Weight:      Height:        General: Pt is alert, awake, not in acute distress Cardiovascular: RRR, S1/S2 +, no rubs, no gallops Respiratory: CTA bilaterally, no wheezing, no rhonchi Abdominal: Soft, NT, ND, bowel sounds + Extremities: no edema, no cyanosis   Discharge Instructions  Discharge Instructions    Diet - low sodium heart healthy   Complete by: As directed    Increase activity slowly   Complete by: As directed      Allergies as of 11/04/2019   No Known Allergies     Medication List    TAKE these medications   ascorbic acid 500 MG tablet Commonly known as: VITAMIN C Take 1 tablet (500 mg total) by mouth daily.   chlorpheniramine-HYDROcodone 10-8 MG/5ML Suer Commonly known as: TUSSIONEX Take 5 mLs by mouth every 12 (twelve) hours.  ipratropium 17 MCG/ACT inhaler Commonly known as: ATROVENT HFA Inhale 2 puffs into the lungs every 6 (six) hours as needed (shortness of breath).   levalbuterol 45 MCG/ACT inhaler Commonly known as: XOPENEX HFA Inhale 2 puffs into the lungs every 8 (eight) hours as needed for wheezing.   Vitamin D3 25 MCG tablet Commonly known as: Vitamin D Take 1 tablet (1,000 Units total) by mouth daily.   zinc sulfate 220 (50 Zn) MG capsule Take 1 capsule (220 mg total) by mouth daily.       No Known Allergies   Procedures/Studies:    CT Angio Chest PE W and/or Wo Contrast  Result Date: 11/01/2019 CLINICAL DATA:  Positive  D-dimer. Concern for pulmonary embolism. COVID positive. Increasing short of breath. EXAM: CT ANGIOGRAPHY CHEST WITH CONTRAST TECHNIQUE: Multidetector CT imaging of the chest was performed using the standard protocol during bolus administration of intravenous contrast. Multiplanar CT image reconstructions and MIPs were obtained to evaluate the vascular anatomy. CONTRAST:  72m OMNIPAQUE IOHEXOL 350 MG/ML SOLN COMPARISON:  Chest radiograph 10/31/2019 FINDINGS: Cardiovascular: No filling defects within the pulmonary arteries to suggest acute pulmonary embolism. No acute findings of the aorta or great vessels. No pericardial fluid. Mediastinum/Nodes: No axillary supraclavicular adenopathy. No mediastinal hilar adenopathy. No pericardial effusion. Esophagus normal. Lungs/Pleura: Linear interstitial thickening within the upper lobes and lower lobes. There is scattered ground-glass densities within the interstitial thickening primarily in the upper lobes. No pleural fluid. No pneumothorax Upper Abdomen: Limited view of the liver, kidneys, pancreas are unremarkable. Normal adrenal glands. Musculoskeletal: No aggressive osseous lesion. Review of the MIP images confirms the above findings. IMPRESSION: 1. No acute pulmonary embolism. 2. Diffuse interstitial thickening with interspersed ground-glass opacities consistent with COVID viral infection. Electronically Signed   By: SSuzy BouchardM.D.   On: 11/01/2019 09:52   DG Chest Port 1 View  Result Date: 11/03/2019 CLINICAL DATA:  Shortness of breath, COVID-19 positive EXAM: PORTABLE CHEST 1 VIEW COMPARISON:  10/31/2019 FINDINGS: Similar mild interstitial prominence, greater at the lung bases. No pleural effusion or pneumothorax. Stable cardiomediastinal contours. IMPRESSION: Similar mild interstitial prominence likely reflecting COVID-19 infection. Electronically Signed   By: PMacy MisM.D.   On: 11/03/2019 10:41   DG Chest Port 1 View  Result Date:  10/31/2019 CLINICAL DATA:  COVID positive, shortness of breath EXAM: PORTABLE CHEST 1 VIEW COMPARISON:  None. FINDINGS: Low lung volumes with minor lower lung bandlike opacities more compatible with atelectasis. Normal heart size and vascularity. Negative for edema, effusion or pneumothorax. Trachea midline. IMPRESSION: Low volume exam with bibasilar atelectasis. Electronically Signed   By: MJerilynn Mages  Shick M.D.   On: 10/31/2019 13:12     The results of significant diagnostics from this hospitalization (including imaging, microbiology, ancillary and laboratory) are listed below for reference.     Microbiology: Recent Results (from the past 240 hour(s))  SARS Coronavirus 2 Ag (30 min TAT) - Nasal Swab (BD Veritor Kit)     Status: Abnormal   Collection Time: 10/31/19  1:34 PM   Specimen: Nasal Swab (BD Veritor Kit)  Result Value Ref Range Status   SARS Coronavirus 2 Ag POSITIVE (A) NEGATIVE Final    Comment: RESULT CALLED TO, READ BACK BY AND VERIFIED WITH: BENTON JOSS '@1458'  ON 10/31/2019, CABELLERO.P (NOTE) SARS-CoV-2 antigen PRESENT. Positive results indicate the presence of viral antigens, but clinical correlation with patient history and other diagnostic information is necessary to determine patient infection status.  Positive results do not rule out bacterial  infection or co-infection  with other viruses. False positive results are rare but can occur, and confirmatory RT-PCR testing may be appropriate in some circumstances. The expected result is Negative. Fact Sheet for Patients: PodPark.tn Fact Sheet for Providers: GiftContent.is  This test is not yet approved or cleared by the [place] FDA and  has been authorized for detection and/or diagnosis of SARS-CoV-2 by FDA under an Emergency Use Authorization (EUA).  This EUA will remain in effect (meaning this test can be used) for the duration of  the COVID-1 9 declaration under  Section 564(b)(1) of the Act, 21 U.S.C. section 360bbb-3(b)(1), unless the authorization is terminated or revoked sooner. Performed at [facility], [address] [place], [place] [address]   Blood Culture (routine x 2)     Status: None (Preliminary result)   Collection Time: 10/31/19  1:36 PM   Specimen: Right Antecubital; Blood  Result Value Ref Range Status   Specimen Description   Final    RIGHT ANTECUBITAL Performed at [facility], [address] [place], [place] [address]    Special Requests   Final    BOTTLES DRAWN AEROBIC AND ANAEROBIC Blood Culture adequate volume Performed at [facility], [address] [place], [place] [address]    Culture   Final    NO GROWTH 4 DAYS Performed at [facility], [place] [address], [place], [place] [address]    Report Status PENDING  Incomplete  Blood Culture (routine x 2)     Status: Abnormal   Collection Time: 10/31/19  1:51 PM   Specimen: Left Antecubital; Blood  Result Value Ref Range Status   Specimen Description   Final    LEFT ANTECUBITAL Performed at [facility], [address] [place], [place] [address]    Special Requests   Final    BOTTLES DRAWN AEROBIC AND ANAEROBIC Blood Culture adequate volume Performed at [facility], [address] [place], [place] [address]    Culture  Setup Time   Final    GRAM POSITIVE COCCI AEROBIC BOTTLE ONLY CRITICAL RESULT CALLED TO, READ BACK BY AND VERIFIED WITH: MHP CHARGE SOPHIE '@1531'  11/01/19 AKT    Culture (A)  Final    STAPHYLOCOCCUS SPECIES (COAGULASE NEGATIVE) THE SIGNIFICANCE OF ISOLATING THIS ORGANISM FROM A SINGLE SET OF BLOOD CULTURES WHEN MULTIPLE SETS ARE DRAWN IS UNCERTAIN. PLEASE NOTIFY THE MICROBIOLOGY DEPARTMENT WITHIN ONE WEEK IF SPECIATION AND SENSITIVITIES ARE REQUIRED. Performed at [facility], [place] [address], [place], [place] [address]    Report Status 11/02/2019 FINAL  Final     Labs: BNP  (last 3 results) Recent Labs    11/01/19 2301  BNP 82.5   Basic Metabolic Panel: Recent Labs  Lab 10/31/19 1332 11/01/19 2301 11/02/19 0518 11/03/19 0250 11/04/19 0154  NA 138 138 139 139 137  K 3.7 4.0 4.3 4.3 4.4  CL 106 107 109 107 106  CO2 '22 22 22 25 25  ' GLUCOSE 84 220* 156* 144* 163*  BUN 28* 31* 32* 31* 31*  CREATININE 1.07 1.12 0.92 0.90 1.10  CALCIUM 8.6* 8.3* 8.5* 8.5* 8.2*   Liver Function Tests: Recent Labs  Lab 10/31/19 1332 11/01/19 2301 11/02/19 0518 11/03/19 0250 11/04/19 0154  AST 33 '30 28 27 27  ' ALT 76* 68* 71* 65* 64*  ALKPHOS 56 60 61 59 61  BILITOT 0.7 0.4 0.6 0.5 0.6  PROT 7.5  7.0 7.2 6.8 6.1*  ALBUMIN 3.4* 3.1* 3.2* 3.1* 2.8*   No results for input(s): LIPASE, AMYLASE in the last 168 hours. No results for input(s): AMMONIA in the last 168 hours. CBC: Recent Labs  Lab 10/31/19 1332 11/01/19 2301 11/02/19 0518 11/03/19 0250 11/04/19 0154  WBC 9.5 9.1 6.2 9.9 11.1*  NEUTROABS 7.4 7.9* 4.9 8.2* 9.3*  HGB 17.3* 16.9 16.6 16.0 15.8  HCT 50.0 48.1 49.5 47.4 46.8  MCV 87.1 87.3 88.9 88.9 90.3  PLT 214 205 222 247 262   Cardiac Enzymes: No results for input(s): CKTOTAL, CKMB, CKMBINDEX, TROPONINI in the last 168 hours. BNP: Invalid input(s): POCBNP CBG: No results for input(s): GLUCAP in the last 168 hours. D-Dimer Recent Labs    11/03/19 0250 11/04/19 0154  DDIMER 0.44 0.36   Hgb A1c No results for input(s): HGBA1C in the last 72 hours. Lipid Profile No results for input(s): CHOL, HDL, LDLCALC, TRIG, CHOLHDL, LDLDIRECT in the last 72 hours. Thyroid function studies No results for input(s): TSH, T4TOTAL, T3FREE, THYROIDAB in the last 72 hours.  Invalid input(s): FREET3 Anemia work up Recent Labs    11/03/19 0250 11/04/19 0154  FERRITIN 592* 472*   Urinalysis No results found for: COLORURINE, APPEARANCEUR, LABSPEC, [place], GLUCOSEU, HGBUR, BILIRUBINUR, [place], PROTEINUR, UROBILINOGEN, NITRITE, LEUKOCYTESUR Sepsis  Labs Invalid input(s): PROCALCITONIN,  WBC,  LACTICIDVEN Microbiology Recent Results (from the past 240 hour(s))  SARS Coronavirus 2 Ag (30 min TAT) - Nasal Swab (BD Veritor Kit)     Status: Abnormal   Collection Time: 10/31/19  1:34 PM   Specimen: Nasal Swab (BD Veritor Kit)  Result Value Ref Range Status   SARS Coronavirus 2 Ag POSITIVE (A) NEGATIVE Final    Comment: RESULT CALLED TO, READ BACK BY AND VERIFIED WITH: BENTON JOSS '@1458'  ON 10/31/2019, CABELLERO.P (NOTE) SARS-CoV-2 antigen PRESENT. Positive results indicate the presence of viral antigens, but clinical correlation with patient history and other diagnostic information is necessary to determine patient infection status.  Positive results do not rule out bacterial infection or co-infection  with other viruses. False positive results are rare but can occur, and confirmatory RT-PCR testing may be appropriate in some circumstances. The expected result is Negative. Fact Sheet for Patients: PodPark.tn Fact Sheet for Providers: GiftContent.is  This test is not yet approved or cleared by the [place] FDA and  has been authorized for detection and/or diagnosis of SARS-CoV-2 by FDA under an Emergency Use Authorization (EUA).  This EUA will remain in effect (meaning this test can be used) for the duration of  the COVID-1 9 declaration under Section 564(b)(1) of the Act, 21 U.S.C. section 360bbb-3(b)(1), unless the authorization is terminated or revoked sooner. Performed at [facility], [address] [place], [place] [address]   Blood Culture (routine x 2)     Status: None (Preliminary result)   Collection Time: 10/31/19  1:36 PM   Specimen: Right Antecubital; Blood  Result Value Ref Range Status   Specimen Description   Final    RIGHT ANTECUBITAL Performed at [facility], [address] [place], [place] [address]    Special Requests    Final    BOTTLES DRAWN AEROBIC AND ANAEROBIC Blood Culture adequate volume Performed at [facility], [address] [place], [place] [address]    Culture   Final    NO GROWTH 4 DAYS Performed at [facility], [place] [address] [place], [place] [address]  Report Status PENDING  Incomplete  Blood Culture (routine x 2)     Status: Abnormal   Collection Time: 10/31/19  1:51 PM   Specimen: Left Antecubital; Blood  Result Value Ref Range Status   Specimen Description   Final    LEFT ANTECUBITAL Performed at [facility], [address] [place], [place] [address]    Special Requests   Final    BOTTLES DRAWN AEROBIC AND ANAEROBIC Blood Culture adequate volume Performed at [facility], [address] [place], [place] [address]    Culture  Setup Time   Final    GRAM POSITIVE COCCI AEROBIC BOTTLE ONLY CRITICAL RESULT CALLED TO, READ BACK BY AND VERIFIED WITH: MHP CHARGE SOPHIE '@1531'  11/01/19 AKT    Culture (A)  Final    STAPHYLOCOCCUS SPECIES (COAGULASE NEGATIVE) THE SIGNIFICANCE OF ISOLATING THIS ORGANISM FROM A SINGLE SET OF BLOOD CULTURES WHEN MULTIPLE SETS ARE DRAWN IS UNCERTAIN. PLEASE NOTIFY THE MICROBIOLOGY DEPARTMENT WITHIN ONE WEEK IF SPECIATION AND SENSITIVITIES ARE REQUIRED. Performed at [facility], [place] [address] [place], [place] [address]    Report Status 11/02/2019 FINAL  Final     Time coordinating discharge in minutes: 65  SIGNED:   Debbe Odea, MD  [facility] 11/04/2019, 8:36 AM Pager   If 7PM-7AM, please contact night-coverage [email or web address] Password [email or web address]

## 2019-11-05 LAB — CULTURE, BLOOD (ROUTINE X 2)
Culture: NO GROWTH
Special Requests: ADEQUATE

## 2019-11-09 DIAGNOSIS — U071 COVID-19: Secondary | ICD-10-CM | POA: Diagnosis not present

## 2019-11-09 DIAGNOSIS — R030 Elevated blood-pressure reading, without diagnosis of hypertension: Secondary | ICD-10-CM | POA: Diagnosis not present

## 2019-11-09 DIAGNOSIS — J129 Viral pneumonia, unspecified: Secondary | ICD-10-CM | POA: Diagnosis not present

## 2019-11-09 DIAGNOSIS — K2900 Acute gastritis without bleeding: Secondary | ICD-10-CM | POA: Diagnosis not present

## 2019-11-10 ENCOUNTER — Other Ambulatory Visit: Payer: Self-pay | Admitting: Cardiology

## 2019-11-10 DIAGNOSIS — R002 Palpitations: Secondary | ICD-10-CM

## 2019-11-10 NOTE — Progress Notes (Signed)
Echo ordered.

## 2019-11-11 ENCOUNTER — Ambulatory Visit (HOSPITAL_BASED_OUTPATIENT_CLINIC_OR_DEPARTMENT_OTHER)
Admission: RE | Admit: 2019-11-11 | Discharge: 2019-11-11 | Disposition: A | Discharge status: Home / Self Care | Payer: BC Managed Care – PPO | Source: Ambulatory Visit | Attending: Internal Medicine | Admitting: Internal Medicine

## 2019-11-11 ENCOUNTER — Other Ambulatory Visit (HOSPITAL_BASED_OUTPATIENT_CLINIC_OR_DEPARTMENT_OTHER): Payer: Self-pay | Admitting: Internal Medicine

## 2019-11-11 ENCOUNTER — Encounter: Payer: Self-pay | Admitting: Cardiology

## 2019-11-11 ENCOUNTER — Other Ambulatory Visit: Payer: Self-pay

## 2019-11-11 DIAGNOSIS — R002 Palpitations: Secondary | ICD-10-CM | POA: Diagnosis not present

## 2019-11-11 DIAGNOSIS — J129 Viral pneumonia, unspecified: Secondary | ICD-10-CM | POA: Insufficient documentation

## 2019-11-11 DIAGNOSIS — R0602 Shortness of breath: Secondary | ICD-10-CM | POA: Diagnosis not present

## 2019-11-11 NOTE — Progress Notes (Signed)
  Echocardiogram 2D Echocardiogram has been performed.  Cesar Pratt 11/11/2019, 2:17 PM

## 2019-11-12 NOTE — Progress Notes (Signed)
Virtual Visit via Video Note   This visit type was conducted due to national recommendations for restrictions regarding the COVID-19 Pandemic (e.g. social distancing) in an effort to limit this patient's exposure and mitigate transmission in our community.  Due to his co-morbid illnesses, this patient is at least at moderate risk for complications without adequate follow up.  This format is felt to be most appropriate for this patient at this time.  All issues noted in this document were discussed and addressed.  A limited physical exam was performed with this format.  Please refer to the patient's chart for his consent to telehealth for [facility].   Date:  11/13/2019   ID:  Cesar Pratt, DOB 03-08-1976, MRN [id number]  Patient Location: Home Provider Location: Office  PCP:  Cesar Brothers, MD  Cardiologist: Dr. Bettina Pratt Electrophysiologist:  None   Evaluation Performed:  New Patient Evaluation at the patient's request  Chief Complaint:  Rapid HR and Hypertension  History of Present Illness:    Cesar Pratt is a 44 y.o. male with recent COVID pneumonia with hypoxic respiratory failure admitted to the hospital 10/31/2019 0 22,021 treated with 5 days of Decadron and Remdesivir along with Convalescent plasma on 1/17.  He was on 2 L of nasal oxygen.  Since discharge she has had rapid heart rate and elevated blood pressure.  In hospital blood pressure was elevated in the range of 157/88 heart rate was in the 60s and 70s.  Oxygen saturation 91 to 95% on nasal oxygen.  BNP level was low 56, renal function normal potassium 4.7, hemoglobin 17.3.  Chest x-ray at discharge showed persistent perihilar interstitial opacities residual of Covid infection.  I reviewed his in hospital records prior to the visit he was not seen by cardiology during the admission.  This is a complicated visit with ongoing problems from COVID-19 infection new problems of tachycardia hypertension chest discomfort rest  today  On discharge from the hospital he was profoundly weak short of breath and had marked exercise intolerance he said he is about 70% improved.  He has noticed that his heart rate is rapid when he stands he has been taking small dose of Bystolic 5 mg daily resting heart rate is moderated to 70 to 80 bpm he is hypertensive in the range of 140/100 and in retrospect had mild hypertension preceding that admission.  He still has cough exertional shortness of breath and has tightness in his chest improved with bronchodilators.  He has had a follow-up chest x-ray and has persistent pulmonary infiltrates and has elevated hemidiaphragm and may have diaphragmatic paresis.  The had no fever chills he has no edema orthopnea no syncope.  No known history of heart disease   Past Medical History:  Diagnosis Date  . COVID-19 virus infection    History reviewed. No pertinent surgical history.   Current Meds  Medication Sig  . nebivolol (BYSTOLIC) 5 MG tablet Take 5 mg by mouth daily.     Allergies:   Patient has no known allergies.   Social History   Tobacco Use  . Smoking status: Never Smoker  . Smokeless tobacco: Never Used  Substance Use Topics  . Alcohol use: Never  . Drug use: Never     Family Hx: The patient's family history includes Atrial fibrillation in his mother; Gastric cancer in his father; Heart failure in his mother.  ROS:  Review of Systems  Constitution: Positive for malaise/fatigue.  HENT: Negative.   Eyes: Negative.  Cardiovascular: Positive for chest pain, dyspnea on exertion and palpitations.  Respiratory: Positive for cough and shortness of breath.   Endocrine: Negative.   Hematologic/Lymphatic: Negative.   Skin: Negative.   Musculoskeletal: Negative.   Gastrointestinal: Negative.   Genitourinary: Negative.   Neurological: Negative.   Psychiatric/Behavioral: Negative.   Allergic/Immunologic: Negative.    Please see the history of present illness.     All  other systems reviewed and are negative.   Prior CV studies:   The following studies were reviewed today:    Labs/Other Tests and Data Reviewed:    Echo 11/11/2019:  1. Left ventricular ejection fraction, by visual estimation, is 60 to 65%. There is borderline left ventricular hypertrophy.  2. The left ventricle has no regional wall motion abnormalities.  3. Global right ventricle has normal systolic function.The right ventricular size is normal. No increase in right ventricular wall thickness.  4. Left atrial size was normal.  5. Right atrial size was normal.  6. The mitral valve is normal in structure. No evidence of mitral valve regurgitation. No evidence of mitral stenosis.  7. The tricuspid valve is normal in structure.  8. The tricuspid valve is normal in structure. Tricuspid valve regurgitation is not demonstrated.  9. The aortic valve is normal in structure. Aortic valve regurgitation is not visualized. No evidence of aortic valve sclerosis or stenosis. 10. The pulmonic valve was normal in structure. Pulmonic valve regurgitation is trivial. 11. Normal pulmonary artery systolic pressure.  EKG:  An ECG dated 11/01/2019 was personally reviewed today and demonstrated:  [place] biatrial abnormality and non specific T waves   CT angiogram negative for PE however showed bilateral pulmonary infiltrates with groundglass appearance suggestive of COVID-19 viral pneumonia.   Ref Range & Units 13 d ago  SARS Coronavirus 2 Ag NEGATIVE POSITIVEAbnormal    Recent Labs: 11/01/2019: B Natriuretic Peptide 55.6 11/04/2019: ALT 64; BUN 31; Creatinine, Ser 1.10; Hemoglobin 15.8; Platelets 262; Potassium 4.4; Sodium 137   Recent Lipid Panel Lab Results  Component Value Date/Time   TRIG 70 10/31/2019 01:32 PM    Wt Readings from Last 3 Encounters:  11/11/19 200 lb (90.7 kg)  10/31/19 200 lb (90.7 kg)     Objective:    Vital Signs:  Ht 5\' 8"  (1.727 m)   Wt 200 lb (90.7 kg)   BMI 30.41  kg/m    VITAL SIGNS:  reviewed GEN:  no acute distress EYES:  sclerae anicteric, EOMI - Extraocular Movements Intact RESPIRATORY:  normal respiratory effort, symmetric expansion CARDIOVASCULAR:  no peripheral edema SKIN:  no rash, lesions or ulcers. MUSCULOSKELETAL:  no obvious deformities. NEURO:  alert and oriented x 3, no obvious focal deficit PSYCH:  normal affect  ASSESSMENT & PLAN:    1. Hypertension poorly controlled start ARB monitor at home continue beta-blocker with tachycardia and autonomic dysfunction.  Next visit we will recheck proBNP renal function.  Need to have a lipid profile for completeness. 2. Continue beta-blocker has had a nice response this will be a transient phenomenon resolve as he improves at this time does not need ambulatory heart rhythm monitor he does have the iPhone adapter and he can check heart rhythm at home 3. He continues to recover and has persistent symptoms from COVID-19 one of them his chest discomfort is atypical but raises concerns he will undergo myocardial perfusion study in our office.  Note in hospital he had no indication of cardiac injury his echocardiogram shows no dysfunction and EKGs did not  show ischemic changes his proBNP level was normal 4. Chest tightness discomfort atypical likely due to pulmonary involvement of COVID-19 will undergo myocardial perfusion study to exclude obstructive CAD if abnormal will need to consider coronary angiography and additional medical therapy.  COVID-19 Education: The signs and symptoms of COVID-19 were discussed with the patient and how to seek care for testing (follow up with PCP or arrange E-visit).  The importance of social distancing was discussed today.  Seen in the office in follow-up  Time:   Today, I have spent 25 minutes with the patient with telehealth technology discussing the above problems.     Medication Adjustments/Labs and Tests Ordered: Current medicines are reviewed at length with the  patient today.  Concerns regarding medicines are outlined above.   Tests Ordered: No orders of the defined types were placed in this encounter.   Medication Changes: No orders of the defined types were placed in this encounter.   Follow Up:  In Person in 3 week(s)  Signed, Shirlee More, MD  11/13/2019 7:49 AM    [facility]

## 2019-11-13 ENCOUNTER — Other Ambulatory Visit: Payer: Self-pay

## 2019-11-13 ENCOUNTER — Encounter: Payer: Self-pay | Admitting: Cardiology

## 2019-11-13 ENCOUNTER — Telehealth (HOSPITAL_COMMUNITY): Payer: Self-pay | Admitting: *Deleted

## 2019-11-13 ENCOUNTER — Telehealth (INDEPENDENT_AMBULATORY_CARE_PROVIDER_SITE_OTHER): Payer: BC Managed Care – PPO | Admitting: Cardiology

## 2019-11-13 VITALS — Ht 68.0 in | Wt 200.0 lb

## 2019-11-13 DIAGNOSIS — I10 Essential (primary) hypertension: Secondary | ICD-10-CM | POA: Diagnosis not present

## 2019-11-13 DIAGNOSIS — R000 Tachycardia, unspecified: Secondary | ICD-10-CM | POA: Insufficient documentation

## 2019-11-13 DIAGNOSIS — Z8616 Personal history of COVID-19: Secondary | ICD-10-CM | POA: Insufficient documentation

## 2019-11-13 MED ORDER — LOSARTAN POTASSIUM 50 MG PO TABS: 50.0000 mg | ORAL_TABLET | Freq: Every day | ORAL | 3 refills | Status: DC

## 2019-11-13 NOTE — Patient Instructions (Signed)
Medication Instructions:  Your physician has recommended you make the following change in your medication: START taking losartan 50 mg (1 tablet) once daily   *If you need a refill on your cardiac medications before your next appointment, please call your pharmacy*  Lab Work: NONE If you have labs (blood work) drawn today and your tests are completely normal, you will receive your results only by: Cesar Pratt MyChart Message (if you have MyChart) OR . A paper copy in the mail If you have any lab test that is abnormal or we need to change your treatment, we will call you to review the results.  Testing/Procedures: Your physician has requested that you have a lexiscan myoview. For further information please visit [email or web address]. Please follow instruction sheet, as given.    Follow-Up: At [facility], you and your health needs are our priority.  As part of our continuing mission to provide you with exceptional heart care, we have created designated Provider Care Teams.  These Care Teams include your primary Cardiologist (physician) and Advanced Practice Providers (APPs -  Physician Assistants and Nurse Practitioners) who all work together to provide you with the care you need, when you need it.  Your next appointment:   1 week(s) after lexi  The format for your next appointment:   In Person  Provider:   Shirlee More, MD  Other Instructions Regadenoson injection What is this medicine? REGADENOSON is used to test the heart for coronary artery disease. It is used in patients who can not exercise for their stress test. This medicine may be used for other purposes; ask your health care provider or pharmacist if you have questions. COMMON BRAND NAME(S): Lexiscan What should I tell my health care provider before I take this medicine? They need to know if you have any of these conditions:  heart problems  lung or breathing disease, like asthma or COPD  an unusual or allergic reaction to  regadenoson, other medicines, foods, dyes, or preservatives  pregnant or trying to get pregnant  breast-feeding How should I use this medicine? This medicine is for injection into a vein. It is given by a health care professional in a hospital or clinic setting. Talk to your pediatrician regarding the use of this medicine in children. Special care may be needed. Overdosage: If you think you have taken too much of this medicine contact a poison control center or emergency room at once. NOTE: This medicine is only for you. Do not share this medicine with others. What if I miss a dose? This does not apply. What may interact with this medicine?  caffeine  dipyridamole  guarana  theophylline This list may not describe all possible interactions. Give your health care provider a list of all the medicines, herbs, non-prescription drugs, or dietary supplements you use. Also tell them if you smoke, drink alcohol, or use illegal drugs. Some items may interact with your medicine. What should I watch for while using this medicine? Your condition will be monitored carefully while you are receiving this medicine. Do not take medicines, foods, or drinks with caffeine (like coffee, tea, or colas) for at least 12 hours before your test. If you do not know if something contains caffeine, ask your health care professional. What side effects may I notice from receiving this medicine? Side effects that you should report to your doctor or health care professional as soon as possible:  allergic reactions like skin rash, itching or hives, swelling of the face, lips, or tongue  breathing problems  chest pain, tightness or palpitations  severe headache Side effects that usually do not require medical attention (report to your doctor or health care professional if they continue or are bothersome):  flushing  headache  irritation or pain at site where injected  nausea, vomiting This list may not  describe all possible side effects. Call your doctor for medical advice about side effects. You may report side effects to FDA at 1-800-FDA-1088. Where should I keep my medicine? This drug is given in a hospital or clinic and will not be stored at home. NOTE: This sheet is a summary. It may not cover all possible information. If you have questions about this medicine, talk to your doctor, pharmacist, or health care provider.  2020 Elsevier/Gold Standard (2008-05-31 15:08:13)  Cardiac Nuclear Scan A cardiac nuclear scan is a test that measures blood flow to the heart when a person is resting and when he or she is exercising. The test looks for problems such as:  Not enough blood reaching a portion of the heart.  The heart muscle not working normally. You may need this test if:  You have heart disease.  You have had abnormal lab results.  You have had heart surgery or a balloon procedure to open up blocked arteries (angioplasty).  You have chest pain.  You have shortness of breath. In this test, a radioactive dye (tracer) is injected into your bloodstream. After the tracer has traveled to your heart, an imaging device is used to measure how much of the tracer is absorbed by or distributed to various areas of your heart. This procedure is usually done at a hospital and takes 2-4 hours. Tell a health care provider about:  Any allergies you have.  All medicines you are taking, including vitamins, herbs, eye drops, creams, and over-the-counter medicines.  Any problems you or family members have had with anesthetic medicines.  Any blood disorders you have.  Any surgeries you have had.  Any medical conditions you have.  Whether you are pregnant or may be pregnant. What are the risks? Generally, this is a safe procedure. However, problems may occur, including:  Serious chest pain and heart attack. This is only a risk if the stress portion of the test is done.  Rapid  heartbeat.  Sensation of warmth in your chest. This usually passes quickly.  Allergic reaction to the tracer. What happens before the procedure?  Ask your health care provider about changing or stopping your regular medicines. This is especially important if you are taking diabetes medicines or blood thinners.  Follow instructions from your health care provider about eating or drinking restrictions.  Remove your jewelry on the day of the procedure. What happens during the procedure?  An IV will be inserted into one of your veins.  Your health care provider will inject a small amount of radioactive tracer through the IV.  You will wait for 20-40 minutes while the tracer travels through your bloodstream.  Your heart activity will be monitored with an electrocardiogram (ECG).  You will lie down on an exam table.  Images of your heart will be taken for about 15-20 minutes.  You may also have a stress test. For this test, one of the following may be done: ? You will exercise on a treadmill or stationary bike. While you exercise, your heart's activity will be monitored with an ECG, and your blood pressure will be checked. ? You will be given medicines that will increase blood flow  to parts of your heart. This is done if you are unable to exercise.  When blood flow to your heart has peaked, a tracer will again be injected through the IV.  After 20-40 minutes, you will get back on the exam table and have more images taken of your heart.  Depending on the type of tracer used, scans may need to be repeated 3-4 hours later.  Your IV line will be removed when the procedure is over. The procedure may vary among health care providers and hospitals. What happens after the procedure?  Unless your health care provider tells you otherwise, you may return to your normal schedule, including diet, activities, and medicines.  Unless your health care provider tells you otherwise, you may increase  your fluid intake. This will help to flush the contrast dye from your body. Drink enough fluid to keep your urine pale yellow.  Ask your health care provider, or the department that is doing the test: ? When will my results be ready? ? How will I get my results? Summary  A cardiac nuclear scan measures the blood flow to the heart when a person is resting and when he or she is exercising.  Tell your health care provider if you are pregnant.  Before the procedure, ask your health care provider about changing or stopping your regular medicines. This is especially important if you are taking diabetes medicines or blood thinners.  After the procedure, unless your health care provider tells you otherwise, increase your fluid intake. This will help flush the contrast dye from your body.  After the procedure, unless your health care provider tells you otherwise, you may return to your normal schedule, including diet, activities, and medicines. This information is not intended to replace advice given to you by your health care provider. Make sure you discuss any questions you have with your health care provider. Document Revised: 03/17/2018 Document Reviewed: 03/17/2018 Elsevier Patient Education  [place].

## 2019-11-13 NOTE — Telephone Encounter (Signed)
Close encounter 

## 2019-11-13 NOTE — Addendum Note (Signed)
Addended by: Beckey Rutter on: 11/13/2019 01:50 PM   Modules accepted: Orders

## 2019-11-17 ENCOUNTER — Ambulatory Visit (HOSPITAL_COMMUNITY)
Admission: RE | Admit: 2019-11-17 | Payer: BC Managed Care – PPO | Source: Ambulatory Visit | Attending: Cardiology | Admitting: Cardiology

## 2019-12-05 DIAGNOSIS — U071 COVID-19: Secondary | ICD-10-CM | POA: Diagnosis not present

## 2019-12-07 ENCOUNTER — Other Ambulatory Visit: Payer: Self-pay | Admitting: Internal Medicine

## 2019-12-07 DIAGNOSIS — R0602 Shortness of breath: Secondary | ICD-10-CM

## 2019-12-09 ENCOUNTER — Telehealth (HOSPITAL_COMMUNITY): Payer: Self-pay | Admitting: *Deleted

## 2019-12-09 ENCOUNTER — Encounter (HOSPITAL_COMMUNITY): Payer: Self-pay | Admitting: *Deleted

## 2019-12-09 NOTE — Progress Notes (Signed)
Cardiology Office Note:    Date:  12/10/2019   ID:  Cesar Pratt, DOB 10/17/1975, MRN [id number]  PCP:  Garwin Brothers, MD  Cardiologist:  Shirlee More, MD    Referring MD: Garwin Brothers, MD    ASSESSMENT:    1. Essential hypertension   2. Sinus tachycardia   3. Dyspnea on exertion   4. Chest pain of uncertain etiology    PLAN:    In order of problems listed above:  1. New problem since COVID-19 continue current treatment beta-blocker ARB and recheck renal function 2. Improved continue beta-blocker 3. Appears to be due to pulmonary injury from COVID-19.  We will recheck inflammatory markers proBNP 4. Quite atypical check perfusion study if normal may benefit from cardiac MR   Next appointment: 3 weeks   Medication Adjustments/Labs and Tests Ordered: Current medicines are reviewed at length with the patient today.  Concerns regarding medicines are outlined above.  Orders Placed This Encounter  Procedures  . Pro b natriuretic peptide (BNP)  . Sedimentation rate  . C-reactive protein  . Ferritin  . EKG 12-Lead   No orders of the defined types were placed in this encounter.   No chief complaint on file.   History of Present Illness:    Cesar Pratt is a 44 y.o. male with a hx of  COVID pneumonia with hypoxic respiratory failure admitted to the hospital 10/31/2019 0 22,021 treated with 5 days of Decadron and Remdesivir along with Convalescent plasma on 1/17.  He was on 2 L of nasal oxygen.  Since discharge he has had rapid heart rate and elevated blood pressure.  In hospital blood pressure was elevated in the range of 157/88 heart rate was in the 60s and 70s.  Oxygen saturation 91 to 95% on nasal oxygen.  BNP level was low 56, renal function normal potassium 4.7, hemoglobin 17.3.  Chest x-ray at discharge showed persistent perihilar interstitial opacities residual of Covid infection.  He was last seen 11/13/2019 and a virtual visit with rapid heart rate hypertension shortness  of breath exercise intolerance and chest discomfort. Compliance with diet, lifestyle and medications: Yes  Echocardiogram performed 11/11/2019 independently reviewed there is borderline left ventricular perjury normal left ventricular size EF 60-65 with normal systolic function.  Both atria are normal in size with no significant valvular abnormality.  Is pending myocardial perfusion study scheduled 12/16/2019.  Blood pressure is controlled with current treatment beta-blocker ARB.  He has orthostatic symptoms of heart racing lightheadedness without hypotension and has chest fullness without physical activity.  His predominant problem is fatigue exertional shortness of breath and ongoing cough and sputum production he is due for high resolution CT of the chest tomorrow.  He is not having typical angina we will do a myocardial perfusion study and if normal I asked him to consider cardiac MRI to exclude cardio injury from COVID-19.  Having symptoms of dysautonomia with bowel and bladder dysfunction. Past Medical History:  Diagnosis Date  . COVID-19 virus infection     History reviewed. No pertinent surgical history.  Current Medications: Current Meds  Medication Sig  . budesonide (PULMICORT) 1 MG/2ML nebulizer solution   . Fexofenadine HCl (MUCINEX ALLERGY PO) Take by mouth.  . fluticasone (FLONASE) 50 MCG/ACT nasal spray Place 1 spray into both nostrils 2 (two) times daily.  Marland Kitchen losartan (COZAAR) 100 MG tablet Take 100 mg by mouth daily.  . nebivolol (BYSTOLIC) 5 MG tablet Take 5 mg by mouth daily.  Allergies:   Patient has no known allergies.   Social History   Socioeconomic History  . Marital status: Married    Spouse name: Not on file  . Number of children: Not on file  . Years of education: Not on file  . Highest education level: Not on file  Occupational History  . Not on file  Tobacco Use  . Smoking status: Never Smoker  . Smokeless tobacco: Never Used  Substance and Sexual  Activity  . Alcohol use: Never  . Drug use: Never  . Sexual activity: Yes  Other Topics Concern  . Not on file  Social History Narrative  . Not on file   Social Determinants of Health   Financial Resource Strain:   . Difficulty of Paying Living Expenses: Not on file  Food Insecurity:   . Worried About Charity fundraiser in the Last Year: Not on file  . Ran Out of Food in the Last Year: Not on file  Transportation Needs:   . Lack of Transportation (Medical): Not on file  . Lack of Transportation (Non-Medical): Not on file  Physical Activity:   . Days of Exercise per Week: Not on file  . Minutes of Exercise per Session: Not on file  Stress:   . Feeling of Stress : Not on file  Social Connections:   . Frequency of Communication with Friends and Family: Not on file  . Frequency of Social Gatherings with Friends and Family: Not on file  . Attends Religious Services: Not on file  . Active Member of Clubs or Organizations: Not on file  . Attends Archivist Meetings: Not on file  . Marital Status: Not on file     Family History: The patient's family history includes Atrial fibrillation in his mother; Gastric cancer in his father; Heart failure in his mother. ROS:   Please see the history of present illness.    All other systems reviewed and are negative.  EKGs/Labs/Other Studies Reviewed:    The following studies were reviewed today:  EKG:  EKG ordered today and personally reviewed.  The ekg ordered today demonstrates sinus rhythm nonspecific T waves precordial  Recent Labs: 11/01/2019: B Natriuretic Peptide 55.6 11/04/2019: ALT 64; BUN 31; Creatinine, Ser 1.10; Hemoglobin 15.8; Platelets 262; Potassium 4.4; Sodium 137  Recent Lipid Panel    Component Value Date/Time   TRIG 70 10/31/2019 1332    Physical Exam:    VS:  BP 120/78   Pulse 66   Temp (!) 97.5 F (36.4 C)   Ht 5\' 8"  (1.727 m)   Wt 215 lb (97.5 kg)   SpO2 99%   BMI 32.69 kg/m     Wt  Readings from Last 3 Encounters:  12/10/19 215 lb (97.5 kg)  11/11/19 200 lb (90.7 kg)  10/31/19 200 lb (90.7 kg)     GEN: He has a cushingoid appearance well nourished, well developed in no acute distress HEENT: Normal NECK: No JVD; No carotid bruits LYMPHATICS: No lymphadenopathy CARDIAC: RRR, no murmurs, rubs, gallops RESPIRATORY: Coarse breath sounds with fibrotic rales diffusely ABDOMEN: Soft, non-tender, non-distended MUSCULOSKELETAL:  No edema; No deformity  SKIN: Warm and dry NEUROLOGIC:  Alert and oriented x 3 PSYCHIATRIC:  Normal affect    Signed, Shirlee More, MD  12/10/2019 4:50 PM    [facility]

## 2019-12-09 NOTE — Telephone Encounter (Signed)
Spoke with patient regarding upcoming Nuclear stress test. Test instructions sent via my chart per patient request.  Kirstie Peri, RN

## 2019-12-10 ENCOUNTER — Encounter: Payer: Self-pay | Admitting: Cardiology

## 2019-12-10 ENCOUNTER — Other Ambulatory Visit: Payer: Self-pay

## 2019-12-10 ENCOUNTER — Ambulatory Visit (INDEPENDENT_AMBULATORY_CARE_PROVIDER_SITE_OTHER): Payer: BC Managed Care – PPO | Admitting: Cardiology

## 2019-12-10 VITALS — BP 120/78 | HR 66 | Temp 97.5°F | Ht 68.0 in | Wt 215.0 lb

## 2019-12-10 DIAGNOSIS — R000 Tachycardia, unspecified: Secondary | ICD-10-CM

## 2019-12-10 DIAGNOSIS — R079 Chest pain, unspecified: Secondary | ICD-10-CM | POA: Diagnosis not present

## 2019-12-10 DIAGNOSIS — R0600 Dyspnea, unspecified: Secondary | ICD-10-CM

## 2019-12-10 DIAGNOSIS — I10 Essential (primary) hypertension: Secondary | ICD-10-CM | POA: Diagnosis not present

## 2019-12-10 DIAGNOSIS — R0609 Other forms of dyspnea: Secondary | ICD-10-CM

## 2019-12-10 NOTE — Patient Instructions (Signed)
Medication Instructions:  Your physician recommends that you continue on your current medications as directed. Please refer to the Current Medication list given to you today.  *If you need a refill on your cardiac medications before your next appointment, please call your pharmacy*   Lab Work: TODAY:  PRO BNP, CRP, SED RATE, & FERRITIN  If you have labs (blood work) drawn today and your tests are completely normal, you will receive your results only by: Marland Kitchen MyChart Message (if you have MyChart) OR . A paper copy in the mail If you have any lab test that is abnormal or we need to change your treatment, we will call you to review the results.   Testing/Procedures: None ordered   Follow-Up: At [facility], you and your health needs are our priority.  As part of our continuing mission to provide you with exceptional heart care, we have created designated Provider Care Teams.  These Care Teams include your primary Cardiologist (physician) and Advanced Practice Providers (APPs -  Physician Assistants and Nurse Practitioners) who all work together to provide you with the care you need, when you need it.  We recommend signing up for the patient portal called "MyChart".  Sign up information is provided on this After Visit Summary.  MyChart is used to connect with patients for Virtual Visits (Telemedicine).  Patients are able to view lab/test results, encounter notes, upcoming appointments, etc.  Non-urgent messages can be sent to your provider as well.   To learn more about what you can do with MyChart, go to [email or web address].    Your next appointment:   3-4 WEEKS  The format for your next appointment:   In Person  Provider:   Shirlee More, MD   Other Instructions

## 2019-12-10 NOTE — Addendum Note (Signed)
Addended by: Gaetano Net on: 12/10/2019 04:56 PM   Modules accepted: Orders

## 2019-12-10 NOTE — Addendum Note (Signed)
Addended by: Gaetano Net on: 12/10/2019 04:58 PM   Modules accepted: Orders

## 2019-12-11 ENCOUNTER — Ambulatory Visit
Admission: RE | Admit: 2019-12-11 | Discharge: 2019-12-11 | Disposition: A | Discharge status: Home / Self Care | Payer: BC Managed Care – PPO | Source: Ambulatory Visit | Attending: Internal Medicine | Admitting: Internal Medicine

## 2019-12-11 DIAGNOSIS — R0602 Shortness of breath: Secondary | ICD-10-CM

## 2019-12-11 DIAGNOSIS — R918 Other nonspecific abnormal finding of lung field: Secondary | ICD-10-CM | POA: Diagnosis not present

## 2019-12-11 LAB — BASIC METABOLIC PANEL
BUN/Creatinine Ratio: 17 (ref 9–20)
BUN: 19 mg/dL (ref 6–24)
CO2: 22 mmol/L (ref 20–29)
Calcium: 9.4 mg/dL (ref 8.7–10.2)
Chloride: 110 mmol/L — ABNORMAL HIGH (ref 96–106)
Creatinine, Ser: 1.12 mg/dL (ref 0.76–1.27)
GFR calc Af Amer: 91 mL/min/{1.73_m2} (ref >59)
GFR calc non Af Amer: 78 mL/min/{1.73_m2} (ref >59)
Glucose: 91 mg/dL (ref 65–99)
Potassium: 3.9 mmol/L (ref 3.5–5.2)
Sodium: 145 mmol/L — ABNORMAL HIGH (ref 134–144)

## 2019-12-11 LAB — SEDIMENTATION RATE: Sed Rate: 13 mm/hr (ref 0–15)

## 2019-12-11 LAB — C-REACTIVE PROTEIN: CRP: 2 mg/L (ref 0–10)

## 2019-12-11 LAB — PRO B NATRIURETIC PEPTIDE: NT-Pro BNP: 126 pg/mL — ABNORMAL HIGH (ref 0–121)

## 2019-12-11 LAB — FERRITIN: Ferritin: 440 ng/mL — ABNORMAL HIGH (ref 30–400)

## 2019-12-15 DIAGNOSIS — F4323 Adjustment disorder with mixed anxiety and depressed mood: Secondary | ICD-10-CM | POA: Diagnosis not present

## 2019-12-16 ENCOUNTER — Other Ambulatory Visit: Payer: Self-pay

## 2019-12-16 ENCOUNTER — Ambulatory Visit (INDEPENDENT_AMBULATORY_CARE_PROVIDER_SITE_OTHER): Payer: BC Managed Care – PPO

## 2019-12-16 DIAGNOSIS — Z8616 Personal history of COVID-19: Secondary | ICD-10-CM | POA: Diagnosis not present

## 2019-12-16 DIAGNOSIS — R000 Tachycardia, unspecified: Secondary | ICD-10-CM

## 2019-12-16 DIAGNOSIS — I10 Essential (primary) hypertension: Secondary | ICD-10-CM

## 2019-12-16 LAB — MYOCARDIAL PERFUSION IMAGING
LV dias vol: 95 mL (ref 62–150)
LV sys vol: 42 mL
Peak HR: 91 {beats}/min
Rest HR: 54 {beats}/min
SDS: 2
SRS: 1
SSS: 3
TID: 1.05

## 2019-12-16 MED ORDER — TECHNETIUM TC 99M TETROFOSMIN IV KIT
29.3000 | PACK | Freq: Once | INTRAVENOUS | Status: AC | PRN
  Administered 2019-12-16: 29.3 via INTRAVENOUS

## 2019-12-16 MED ORDER — TECHNETIUM TC 99M TETROFOSMIN IV KIT
10.8000 | PACK | Freq: Once | INTRAVENOUS | Status: AC | PRN
  Administered 2019-12-16: 10.8 via INTRAVENOUS

## 2019-12-16 MED ORDER — REGADENOSON 0.4 MG/5ML IV SOLN
0.4000 mg | Freq: Once | INTRAVENOUS | Status: AC
  Administered 2019-12-16: 0.4 mg via INTRAVENOUS

## 2019-12-23 ENCOUNTER — Other Ambulatory Visit: Payer: Self-pay

## 2019-12-23 ENCOUNTER — Ambulatory Visit (INDEPENDENT_AMBULATORY_CARE_PROVIDER_SITE_OTHER): Payer: BC Managed Care – PPO | Admitting: Internal Medicine

## 2019-12-23 DIAGNOSIS — R0602 Shortness of breath: Secondary | ICD-10-CM | POA: Diagnosis not present

## 2019-12-23 LAB — PULMONARY FUNCTION TEST

## 2019-12-24 ENCOUNTER — Ambulatory Visit: Payer: BC Managed Care – PPO | Admitting: Internal Medicine

## 2019-12-29 DIAGNOSIS — F4323 Adjustment disorder with mixed anxiety and depressed mood: Secondary | ICD-10-CM | POA: Diagnosis not present

## 2020-01-02 DIAGNOSIS — U071 COVID-19: Secondary | ICD-10-CM | POA: Diagnosis not present

## 2020-01-12 DIAGNOSIS — F4323 Adjustment disorder with mixed anxiety and depressed mood: Secondary | ICD-10-CM | POA: Diagnosis not present

## 2020-01-14 ENCOUNTER — Ambulatory Visit: Payer: BC Managed Care – PPO | Admitting: Cardiology

## 2020-01-20 DIAGNOSIS — R0602 Shortness of breath: Secondary | ICD-10-CM | POA: Diagnosis not present

## 2020-01-20 DIAGNOSIS — R0600 Dyspnea, unspecified: Secondary | ICD-10-CM | POA: Diagnosis not present

## 2020-01-20 DIAGNOSIS — R001 Bradycardia, unspecified: Secondary | ICD-10-CM | POA: Diagnosis not present

## 2020-01-20 DIAGNOSIS — R0789 Other chest pain: Secondary | ICD-10-CM | POA: Diagnosis not present

## 2020-01-20 DIAGNOSIS — Z8616 Personal history of COVID-19: Secondary | ICD-10-CM | POA: Diagnosis not present

## 2020-01-20 DIAGNOSIS — R918 Other nonspecific abnormal finding of lung field: Secondary | ICD-10-CM | POA: Diagnosis not present

## 2020-01-20 DIAGNOSIS — Z79899 Other long term (current) drug therapy: Secondary | ICD-10-CM | POA: Diagnosis not present

## 2020-01-20 DIAGNOSIS — I119 Hypertensive heart disease without heart failure: Secondary | ICD-10-CM | POA: Diagnosis not present

## 2020-01-20 DIAGNOSIS — R5383 Other fatigue: Secondary | ICD-10-CM | POA: Diagnosis not present

## 2020-01-20 DIAGNOSIS — D72829 Elevated white blood cell count, unspecified: Secondary | ICD-10-CM | POA: Diagnosis not present

## 2020-01-20 DIAGNOSIS — M6240 Contracture of muscle, unspecified site: Secondary | ICD-10-CM | POA: Diagnosis not present

## 2020-01-20 DIAGNOSIS — R079 Chest pain, unspecified: Secondary | ICD-10-CM | POA: Diagnosis not present

## 2020-01-20 DIAGNOSIS — R9431 Abnormal electrocardiogram [ECG] [EKG]: Secondary | ICD-10-CM | POA: Diagnosis not present

## 2020-01-20 DIAGNOSIS — R05 Cough: Secondary | ICD-10-CM | POA: Diagnosis not present

## 2020-01-20 DIAGNOSIS — M6248 Contracture of muscle, other site: Secondary | ICD-10-CM | POA: Diagnosis not present

## 2020-01-22 DIAGNOSIS — Z8616 Personal history of COVID-19: Secondary | ICD-10-CM | POA: Diagnosis not present

## 2020-01-26 DIAGNOSIS — R202 Paresthesia of skin: Secondary | ICD-10-CM | POA: Diagnosis not present

## 2020-01-26 DIAGNOSIS — F4323 Adjustment disorder with mixed anxiety and depressed mood: Secondary | ICD-10-CM | POA: Diagnosis not present

## 2020-01-26 DIAGNOSIS — G909 Disorder of the autonomic nervous system, unspecified: Secondary | ICD-10-CM | POA: Diagnosis not present

## 2020-02-02 DIAGNOSIS — U071 COVID-19: Secondary | ICD-10-CM | POA: Diagnosis not present

## 2020-02-07 NOTE — Procedures (Signed)
[place], [id number]  DATE OF SERVICE: December 23, 2019  Complete Pulmonary Function Testing Interpretation:  FINDINGS:  The forced vital capacity is normal.  FEV1 is 3.29 L which is 90% of predicted and is normal.  FEV1 FVC ratio is normal.  Postbronchodilator no significant change in FEV1 clinical improvement may still occur in the absence of spirometric improvement.  Total lung capacity is mildly decreased.  Residual volume is decreased.  Residual volume total lung capacity ratio is decreased.  FRC is decreased.  DLCO was within normal limits.  IMPRESSION:  This pulmonary function study is consistent with mild restrictive lung disease.  There does not appear to be any response to bronchodilators however clinical improvement may occur in the absence of spirometric improvement.  DLCO is within normal limits.  Allyne Gee, MD [facility] Pulmonary Critical Care Medicine Sleep Medicine

## 2020-02-12 DIAGNOSIS — R0609 Other forms of dyspnea: Secondary | ICD-10-CM | POA: Diagnosis not present

## 2020-02-12 DIAGNOSIS — D472 Monoclonal gammopathy: Secondary | ICD-10-CM | POA: Diagnosis not present

## 2020-02-15 DIAGNOSIS — D472 Monoclonal gammopathy: Secondary | ICD-10-CM | POA: Diagnosis not present

## 2020-02-15 DIAGNOSIS — R0609 Other forms of dyspnea: Secondary | ICD-10-CM | POA: Diagnosis not present

## 2020-03-03 DIAGNOSIS — U071 COVID-19: Secondary | ICD-10-CM | POA: Diagnosis not present

## 2020-03-08 DIAGNOSIS — F4323 Adjustment disorder with mixed anxiety and depressed mood: Secondary | ICD-10-CM | POA: Diagnosis not present

## 2020-04-01 DIAGNOSIS — B948 Sequelae of other specified infectious and parasitic diseases: Secondary | ICD-10-CM | POA: Diagnosis not present

## 2020-04-01 DIAGNOSIS — Z8616 Personal history of COVID-19: Secondary | ICD-10-CM | POA: Diagnosis not present

## 2020-04-01 DIAGNOSIS — R0600 Dyspnea, unspecified: Secondary | ICD-10-CM | POA: Diagnosis not present

## 2020-04-01 DIAGNOSIS — R05 Cough: Secondary | ICD-10-CM | POA: Diagnosis not present

## 2020-04-03 DIAGNOSIS — U071 COVID-19: Secondary | ICD-10-CM | POA: Diagnosis not present

## 2020-04-05 DIAGNOSIS — F4323 Adjustment disorder with mixed anxiety and depressed mood: Secondary | ICD-10-CM | POA: Diagnosis not present

## 2020-04-12 DIAGNOSIS — K0889 Other specified disorders of teeth and supporting structures: Secondary | ICD-10-CM | POA: Diagnosis not present

## 2020-04-20 ENCOUNTER — Ambulatory Visit (INDEPENDENT_AMBULATORY_CARE_PROVIDER_SITE_OTHER): Payer: BC Managed Care – PPO | Admitting: Allergy and Immunology

## 2020-04-20 ENCOUNTER — Other Ambulatory Visit: Payer: Self-pay

## 2020-04-20 VITALS — BP 136/90 | HR 85 | Resp 16 | Ht 66.5 in | Wt 211.4 lb

## 2020-04-20 DIAGNOSIS — U071 COVID-19: Secondary | ICD-10-CM | POA: Diagnosis not present

## 2020-04-20 DIAGNOSIS — J984 Other disorders of lung: Secondary | ICD-10-CM | POA: Diagnosis not present

## 2020-04-20 DIAGNOSIS — G909 Disorder of the autonomic nervous system, unspecified: Secondary | ICD-10-CM | POA: Diagnosis not present

## 2020-04-20 MED ORDER — AUVI-Q 0.3 MG/0.3ML IJ SOAJ: 0.3000 mg | INTRAMUSCULAR | 3 refills | Status: DC | PRN

## 2020-04-20 MED ORDER — DUPILUMAB 300 MG/2ML ~~LOC~~ SOSY
600.0000 mg | PREFILLED_SYRINGE | SUBCUTANEOUS | Status: AC
  Administered 2020-04-20: 600 mg via SUBCUTANEOUS

## 2020-04-20 NOTE — Progress Notes (Signed)
National   Follow-up Note  Referring Provider: Garwin Brothers, MD Primary Provider: Garwin Brothers, MD Date of Office Visit: 04/20/2020  Subjective:   Cesar Pratt (DOB: February 08, 1976) is a 44 y.o. 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  He contracted Covid January 2021 with multiorgan involvement.    He makes copious amounts of mucus from both his upper and lower airway throughout the day and this is his major complaint.  He must spend what sounds like 3 hours a day with 1 hour per episode to clear out his head and chest of thick tenacious mucus production.  He has tried multiple treatments in the past including hypertonic saline and nebulized Pulmicort and Mucomyst and DuoNeb and albuterol none of which has really helped him very much.  He now basically performs a saline wash of his airway 3 times per day.    He has been to see the academic Covid center at [facility] for advice about this issue and has not really received a adequate explanation for why this is occurring other than possible autonomic dysfunction.    His other issues revolve around the fact that he has rather significant inability to exercise.  He just gets "locked up" in his body as though he cannot make his limbs do what they should do whenever he exercises.  He does have some myalgia and tendinitis that is presently being treated with gabapentin successfully for the most part.  There is also a history of him developing MGUS since his infection  Allergies as of 04/20/2020      Reactions   Minocycline Rash   Other Anaphylaxis   Goat cheese    AeroChamber Plus Flo-Vu w/Mask Misc See admin instructions.   gabapentin 300 MG capsule Commonly known as:  NEURONTIN Take 300 mg by mouth 3 (three) times daily.   GINGER ROOT PO Take by mouth.   GREEN TEA EXTRACT PO Take by mouth.   pantoprazole 40 MG tablet Commonly known as: PROTONIX Take 40 mg by mouth 2 (two) times daily.   QC TUMERIC COMPLEX PO Take by mouth.       Past Medical History:  Diagnosis Date  . COVID-19 virus infection     No past surgical history on file.  Review of systems negative except as noted in HPI / PMHx or noted below:  Review of Systems  Constitutional: Negative.   HENT: Negative.   Eyes: Negative.   Respiratory: Negative.   Cardiovascular: Negative.   Gastrointestinal: Negative.   Genitourinary: Negative.   Musculoskeletal: Negative.   Skin: Negative.   Neurological: Negative.   Endo/Heme/Allergies: Negative.   Psychiatric/Behavioral: Negative.      Objective:   Vitals:   04/20/20 1619  BP: 136/90  Pulse: 85  Resp: 16  SpO2: 95%   Height: 5' 6.5" (168.9 cm)  Weight: 211 lb 6.4 oz (95.9 kg)   Physical Exam Constitutional:      Appearance: He is not diaphoretic.  HENT:     Head: Normocephalic.     Right Ear: Tympanic membrane, ear canal and external ear normal.     Left Ear: Tympanic membrane, ear canal and external ear normal.     Nose: Nose normal. No mucosal edema or rhinorrhea.  Mouth/Throat:     Pharynx: Uvula midline. No oropharyngeal exudate.  Eyes:     Conjunctiva/sclera: Conjunctivae normal.  Neck:     Thyroid: No thyromegaly.     Trachea: Trachea normal. No tracheal tenderness or tracheal deviation.  Cardiovascular:     Rate and Rhythm: Normal rate and regular rhythm.     Heart sounds: Normal heart sounds, S1 normal and S2 normal. No murmur heard.   Pulmonary:     Effort: No respiratory distress.     Breath sounds: Normal breath sounds. No stridor. No wheezing or rales.  Lymphadenopathy:     Head:     Right side of head: No tonsillar adenopathy.     Left side of head: No tonsillar adenopathy.      Cervical: No cervical adenopathy.  Skin:    Findings: No erythema or rash.     Nails: There is no clubbing.  Neurological:     Mental Status: He is alert.     Diagnostics:   Results of a high-resolution chest CT scan obtained 11 December 2019 identified the following:  Cardiovascular: No significant vascular findings. Normal heart size. No pericardial effusion.  Mediastinum/Nodes: No enlarged mediastinal, hilar, or axillary lymph nodes. Thyroid gland, trachea, and esophagus demonstrate no significant findings.  Lungs/Pleura: Scattered ground-glass pulmonary opacity throughout the lungs, most notable in the upper lobes (series 5, image 39). Mild, lobular air trapping on expiratory phase imaging. Numerous small bilateral pulmonary nodules, measuring 4 mm or smaller, which appear to be new compared to prior examination. No pleural effusion or pneumothorax.  Results of a chest CT scan obtained 20 January 2020 identified the following:  4/7 CT scan normal, no atelectasis or mucous plugging noted. Previously seen opacities and interstitial marking have resolved.  Scattered tiny solid pulmonary nodules. Otherwise normal CT examination of the chest. No abnormality seen to explain dyspnea.  Results of pulmonary function testing performed 23 December 2019 identifies the following:  FEV1 is 3.29 L which is 90% of predicted.  FEV1 FVC ratio is normal.  Postbronchodilator no significant change in FEV1. TLC 78.8% predicted, RV 45.5% predicted, normal DLCO.  Results of blood tests obtained 26 January 2020 identifies a monoclonal immunoglobulin present in the gamma region at a concentration of 0.17 NG/DL which appears to be an IgG kappa and a smaller region of constricted electrophoretic mobility and IgG lambda.  Assessment and Plan:   1. COVID-19 with pulmonary comorbidity    Haig's main problem is airway mucus production and dyspnea that has developed as a result of Covid infection with  significant pneumonitis and neuropathy with autonomic dysfunction.  I have given him a trial of dupilumab with the assumption that some of his mucus production may be driven by unregulated IL-13 production as a result of immune dysregulation that secondary to his Covid infection.  However, all of his mucus issue may be tied up with autonomic dysfunction.  We should have a good idea whether or not dupilumab is going to have an impact for his mucus production issue within 6 weeks.  I have given him his first loading dose of dupilumab today and he will utilize a sample in 2 weeks in 4 weeks thus adding to a total of 6 weeks of therapy.  As well, he probably needs to enter into some type of progressive aerobic exercise program to retrain his cardiovascular and nervous system as both of these organ systems have been severely impacted by his Covid infection.  He has developed  an MGUS.  I would not be surprised at all if his MGUS is a manifestation of antibody production directed against Covid.  We will see if we can get a quantitative IgG titer of anti-RBD antibodies to see if this titer matches up to the level of his IgG kappa monoclonal antibody.  Tomislav will keep in contact with me noting his response to this approach.  Allena Katz, MD Allergy / Immunology [place]

## 2020-04-20 NOTE — Progress Notes (Signed)
Patient was started on dupixent today in office. Loading dose was given. Patient self-administered one injection into abdomen and I administered second dose. Patient signed consent, was given dupixent pamphlet, and taught Auvi-Q device instructions. Patient also went home with 1 month supply of Dupixent and instructed to refrigerate medication and that he will be due for 1 injection every 2 weeks. Patient had no adverse reactions or had any other questions at this time.

## 2020-04-21 ENCOUNTER — Encounter: Payer: Self-pay | Admitting: Allergy and Immunology

## 2020-04-28 DIAGNOSIS — D472 Monoclonal gammopathy: Secondary | ICD-10-CM | POA: Diagnosis not present

## 2020-05-03 DIAGNOSIS — F4323 Adjustment disorder with mixed anxiety and depressed mood: Secondary | ICD-10-CM | POA: Diagnosis not present

## 2020-05-03 DIAGNOSIS — U071 COVID-19: Secondary | ICD-10-CM | POA: Diagnosis not present

## 2020-05-05 DIAGNOSIS — M50321 Other cervical disc degeneration at C4-C5 level: Secondary | ICD-10-CM | POA: Diagnosis not present

## 2020-05-05 DIAGNOSIS — M531 Cervicobrachial syndrome: Secondary | ICD-10-CM | POA: Diagnosis not present

## 2020-05-05 DIAGNOSIS — M62838 Other muscle spasm: Secondary | ICD-10-CM | POA: Diagnosis not present

## 2020-05-05 DIAGNOSIS — M50322 Other cervical disc degeneration at C5-C6 level: Secondary | ICD-10-CM | POA: Diagnosis not present

## 2020-05-10 DIAGNOSIS — M531 Cervicobrachial syndrome: Secondary | ICD-10-CM | POA: Diagnosis not present

## 2020-05-10 DIAGNOSIS — M62838 Other muscle spasm: Secondary | ICD-10-CM | POA: Diagnosis not present

## 2020-05-10 DIAGNOSIS — M50321 Other cervical disc degeneration at C4-C5 level: Secondary | ICD-10-CM | POA: Diagnosis not present

## 2020-05-10 DIAGNOSIS — M50322 Other cervical disc degeneration at C5-C6 level: Secondary | ICD-10-CM | POA: Diagnosis not present

## 2020-05-12 DIAGNOSIS — M50322 Other cervical disc degeneration at C5-C6 level: Secondary | ICD-10-CM | POA: Diagnosis not present

## 2020-05-12 DIAGNOSIS — M62838 Other muscle spasm: Secondary | ICD-10-CM | POA: Diagnosis not present

## 2020-05-12 DIAGNOSIS — M50321 Other cervical disc degeneration at C4-C5 level: Secondary | ICD-10-CM | POA: Diagnosis not present

## 2020-05-12 DIAGNOSIS — M531 Cervicobrachial syndrome: Secondary | ICD-10-CM | POA: Diagnosis not present

## 2020-05-24 DIAGNOSIS — F4323 Adjustment disorder with mixed anxiety and depressed mood: Secondary | ICD-10-CM | POA: Diagnosis not present

## 2020-06-03 DIAGNOSIS — U071 COVID-19: Secondary | ICD-10-CM | POA: Diagnosis not present

## 2020-06-28 DIAGNOSIS — F4323 Adjustment disorder with mixed anxiety and depressed mood: Secondary | ICD-10-CM | POA: Diagnosis not present

## 2020-07-04 DIAGNOSIS — U071 COVID-19: Secondary | ICD-10-CM | POA: Diagnosis not present

## 2020-08-03 DIAGNOSIS — U071 COVID-19: Secondary | ICD-10-CM | POA: Diagnosis not present

## 2020-08-08 DIAGNOSIS — M542 Cervicalgia: Secondary | ICD-10-CM | POA: Diagnosis not present

## 2020-08-08 DIAGNOSIS — M545 Low back pain, unspecified: Secondary | ICD-10-CM | POA: Diagnosis not present

## 2020-08-15 DIAGNOSIS — Z6834 Body mass index (BMI) 34.0-34.9, adult: Secondary | ICD-10-CM | POA: Diagnosis not present

## 2020-08-15 DIAGNOSIS — E669 Obesity, unspecified: Secondary | ICD-10-CM | POA: Diagnosis not present

## 2020-09-03 DIAGNOSIS — U071 COVID-19: Secondary | ICD-10-CM | POA: Diagnosis not present

## 2020-09-21 DIAGNOSIS — R059 Cough, unspecified: Secondary | ICD-10-CM | POA: Diagnosis not present

## 2020-09-21 DIAGNOSIS — R29898 Other symptoms and signs involving the musculoskeletal system: Secondary | ICD-10-CM | POA: Diagnosis not present

## 2020-09-21 DIAGNOSIS — U099 Post covid-19 condition, unspecified: Secondary | ICD-10-CM | POA: Diagnosis not present

## 2020-09-21 DIAGNOSIS — D472 Monoclonal gammopathy: Secondary | ICD-10-CM | POA: Diagnosis not present

## 2020-10-03 DIAGNOSIS — U071 COVID-19: Secondary | ICD-10-CM | POA: Diagnosis not present

## 2020-11-03 DIAGNOSIS — U071 COVID-19: Secondary | ICD-10-CM | POA: Diagnosis not present

## 2020-12-04 DIAGNOSIS — U071 COVID-19: Secondary | ICD-10-CM | POA: Diagnosis not present

## 2020-12-07 DIAGNOSIS — R059 Cough, unspecified: Secondary | ICD-10-CM | POA: Diagnosis not present

## 2020-12-28 IMAGING — DX DG CHEST 1V PORT
1 series · 1 of 1 positions shown · non-contrast
Comparison: 10/31/2019

CLINICAL DATA: Shortness of breath, MVRBG-LE positive

EXAM:
PORTABLE CHEST 1 VIEW

[chest ap]
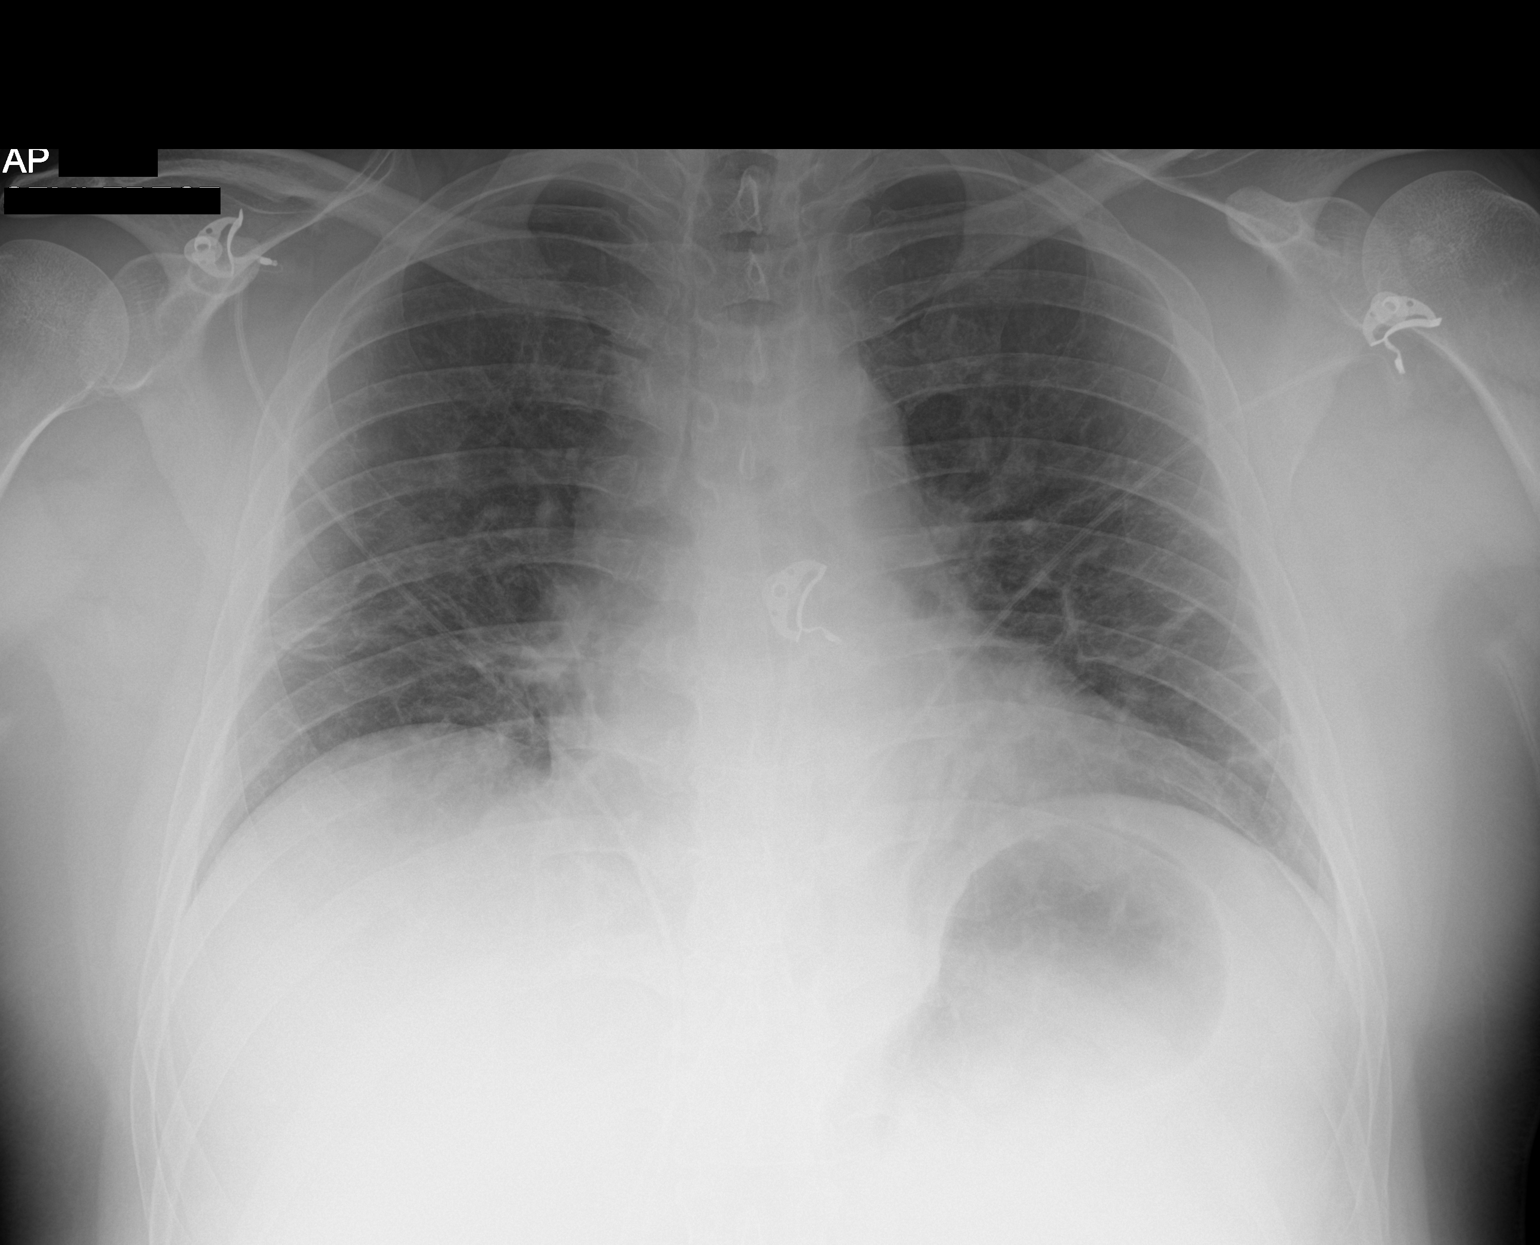

[1 of 1 positions shown; findings below may reference images not displayed]

FINDINGS: Similar mild interstitial prominence, greater at the lung bases. No
pleural effusion or pneumothorax. Stable cardiomediastinal contours.
IMPRESSION: Similar mild interstitial prominence likely reflecting MVRBG-LE
infection.

## 2021-01-01 DIAGNOSIS — U071 COVID-19: Secondary | ICD-10-CM | POA: Diagnosis not present

## 2021-02-01 DIAGNOSIS — U071 COVID-19: Secondary | ICD-10-CM | POA: Diagnosis not present

## 2021-03-03 DIAGNOSIS — U071 COVID-19: Secondary | ICD-10-CM | POA: Diagnosis not present

## 2021-04-03 DIAGNOSIS — U071 COVID-19: Secondary | ICD-10-CM | POA: Diagnosis not present

## 2021-04-25 DIAGNOSIS — Z6834 Body mass index (BMI) 34.0-34.9, adult: Secondary | ICD-10-CM | POA: Diagnosis not present

## 2021-04-25 DIAGNOSIS — E669 Obesity, unspecified: Secondary | ICD-10-CM | POA: Diagnosis not present

## 2021-04-25 DIAGNOSIS — U099 Post covid-19 condition, unspecified: Secondary | ICD-10-CM | POA: Diagnosis not present

## 2021-05-03 DIAGNOSIS — U071 COVID-19: Secondary | ICD-10-CM | POA: Diagnosis not present

## 2021-05-26 DIAGNOSIS — E669 Obesity, unspecified: Secondary | ICD-10-CM | POA: Diagnosis not present

## 2021-05-26 DIAGNOSIS — Z6833 Body mass index (BMI) 33.0-33.9, adult: Secondary | ICD-10-CM | POA: Diagnosis not present

## 2021-06-03 DIAGNOSIS — U071 COVID-19: Secondary | ICD-10-CM | POA: Diagnosis not present

## 2021-07-04 DIAGNOSIS — U071 COVID-19: Secondary | ICD-10-CM | POA: Diagnosis not present

## 2021-08-03 DIAGNOSIS — Z23 Encounter for immunization: Secondary | ICD-10-CM | POA: Diagnosis not present

## 2021-08-03 DIAGNOSIS — U071 COVID-19: Secondary | ICD-10-CM | POA: Diagnosis not present

## 2021-08-23 ENCOUNTER — Other Ambulatory Visit: Payer: Self-pay

## 2021-08-23 ENCOUNTER — Ambulatory Visit (INDEPENDENT_AMBULATORY_CARE_PROVIDER_SITE_OTHER): Payer: BC Managed Care – PPO | Admitting: Gastroenterology

## 2021-08-23 ENCOUNTER — Encounter: Payer: Self-pay | Admitting: Gastroenterology

## 2021-08-23 VITALS — BP 138/72 | HR 98 | Ht 69.0 in | Wt 198.0 lb

## 2021-08-23 DIAGNOSIS — K219 Gastro-esophageal reflux disease without esophagitis: Secondary | ICD-10-CM

## 2021-08-23 DIAGNOSIS — R194 Change in bowel habit: Secondary | ICD-10-CM

## 2021-08-23 DIAGNOSIS — Z800 Family history of malignant neoplasm of digestive organs: Secondary | ICD-10-CM | POA: Diagnosis not present

## 2021-08-23 NOTE — Patient Instructions (Addendum)
.  dottie If you are age 47 or older, your body mass index should be between 23-30. Your Body mass index is 29.24 kg/m. If this is out of the aforementioned range listed, please consider follow up with your Primary Care Provider.  If you are age 23 or younger, your body mass index should be between 19-25. Your Body mass index is 29.24 kg/m. If this is out of the aformentioned range listed, please consider follow up with your Primary Care Provider.   ________________________________________________________  The [place] [facility] providers would like to encourage you to use [facility] to communicate with providers for non-urgent requests or questions.  Due to long hold times on the telephone, sending your provider a message by [facility] may be a faster and more efficient way to get a response.  Please allow 48 business hours for a response.  Please remember that this is for non-urgent requests.  _______________________________________________________  We have given you samples of the following medication to take: Please start with linzess 36mcg and try it for one week. You can move up to the 174mcg if you notice the 46mcg isn't helping. Please let us know how this is working for you.  You have been scheduled for an endoscopy and colonoscopy. Please follow the written instructions given to you at your visit today. Please pick up your prep supplies at the pharmacy within the next 1-3 days. If you use inhalers (even only as needed), please bring them with you on the day of your procedure.  Please call with any questions or concerns.  Thank you,  Dr. Jackquline Denmark

## 2021-08-23 NOTE — Progress Notes (Addendum)
Chief Complaint:   Referring Provider:  Garwin Brothers, MD      ASSESSMENT AND PLAN;   #1. GERD with ?Barrett's esophagus.  #2. FH gastric Ca (Dad)  #3. IBS-C. Failed montegrity.  #4.  Post COVID syndrome with autonomic dysfunction.   Plan: -EGD/colon -Linzess 31mcg PO QD (samples given) -If continued problems, will perform further work-up.   I discussed EGD/Colonoscopy- the indications, risks, alternatives and potential complications including, but not limited to, bleeding, infection, reaction to medication, damage to internal organs, cardiac and/or pulmonary problems, and perforation requiring surgery (1 to 2 in 1000). The possibility that significant findings could be missed was explained. All ? were answered. The patient gives consent to proceed.  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  Had Covid pneumonia Jan 2021- hospitalized x 6 days-complicated by long COVID including autonomic dysfunction.  Had significant secretions.  Better now with Dupixent.   Here with urinary and fecal incontinence along with constipation.  Also has longstanding gastroesophageal reflux with family history of gastric cancer.  Advised to get EGD/colonoscopy performed.  Has failed trial of Motegrity.  Currently having BM 1/day Previously would have BMs in 3 to 4 days with pellet-like stools.  He has been drinking plenty of water.  Has lost significant weight as below  Wt Readings from Last 3 Encounters:  08/23/21 198 lb (89.8 kg)  04/20/20 211 lb 6.4 oz (95.9 kg)  12/16/19 220 lb (99.8 kg)   Past GI procedures Colonoscopy 02/10/2016 ([facility]): Mild predominantly sigmoid diverticulosis, small internal hemorrhoids. EGD 01/2016: 1 cm salmon-colored tonguelike projection above the GE junction.  Minimal hiatal hernia. Bx-reflux.  No metaplasia.  Past Medical History:  Diagnosis Date   COVID-19 virus  infection    Diverticulitis    GERD (gastroesophageal reflux disease)     Past Surgical History:  Procedure Laterality Date   COLONOSCOPY  02/10/2016   Mild predominantly sigmoid diverticulosis. Small internal hemorrhoids.   COLONOSCOPY WITH ESOPHAGOGASTRODUODENOSCOPY (EGD)     x2 [place]   ESOPHAGOGASTRODUODENOSCOPY  02/10/2016   Questionable short-segment barrett's esophagus (biopsied) Minimal transient hiatal hernia.    Family History  Problem Relation Age of Onset   Atrial fibrillation Mother    Heart failure Mother    Gastric cancer Father     Social History   Tobacco Use   Smoking status: Never   Smokeless tobacco: Never  Vaping Use   Vaping Use: Never used  Substance Use Topics   Alcohol use: Never   Drug use: Never    Current Outpatient Medications  Medication Sig Dispense Refill   cyclobenzaprine (FLEXERIL) 10 MG tablet Take 10 mg by mouth 3 (three) times daily as needed for muscle spasms.     gabapentin (NEURONTIN) 300 MG capsule Take 300 mg by mouth 3 (three) times daily.     Current Facility-Administered Medications  Medication Dose Route Frequency Provider Last Rate Last Admin   dupilumab (DUPIXENT) prefilled syringe 600 mg  600 mg Subcutaneous Q14 Days Kozlow, Donnamarie Poag, MD   600 mg at 04/20/20 1719    Allergies  Allergen Reactions   Minocycline Rash   Other Anaphylaxis    Goat cheese    Review of Systems:  Constitutional: Denies fever, chills, diaphoresis, appetite change and fatigue.  HEENT: Denies photophobia, eye pain, redness, hearing loss, ear pain, congestion, sore throat, rhinorrhea, sneezing, mouth sores, neck pain,  neck stiffness and tinnitus.   Respiratory: Denies SOB, DOE, cough, chest tightness,  and wheezing.   Cardiovascular: Denies chest pain, palpitations and leg swelling.  Genitourinary: Denies dysuria, urgency, frequency, hematuria, flank pain and difficulty urinating.  Musculoskeletal: Denies myalgias, back pain, joint  swelling, arthralgias and gait problem.  Skin: No rash.  Neurological: Denies dizziness, seizures, syncope, weakness, light-headedness, numbness and headaches.  Hematological: Denies adenopathy. Easy bruising, personal or family bleeding history  Psychiatric/Behavioral: No anxiety or depression     Physical Exam:    BP 138/72 (BP Location: Left Arm, Patient Position: Sitting, Cuff Size: Normal)   Pulse 98   Ht 5\' 9"  (1.753 m)   Wt 198 lb (89.8 kg)   SpO2 98%   BMI 29.24 kg/m  Wt Readings from Last 3 Encounters:  08/23/21 198 lb (89.8 kg)  04/20/20 211 lb 6.4 oz (95.9 kg)  12/16/19 220 lb (99.8 kg)   Constitutional:  Well-developed, in no acute distress. Psychiatric: Normal mood and affect. Behavior is normal. HEENT: Pupils normal.  Conjunctivae are normal. No scleral icterus. Cardiovascular: Normal rate, regular rhythm. No edema Pulmonary/chest: Effort normal and breath sounds normal. No wheezing, rales or rhonchi. Abdominal: Soft, nondistended. Nontender. Bowel sounds active throughout. There are no masses palpable. No hepatomegaly. Rectal: Deferred Neurological: Alert and oriented to person place and time. Skin: Skin is warm and dry. No rashes noted.  Data Reviewed: I have personally reviewed following labs and imaging studies  CBC: CBC Latest Ref Rng & Units 11/04/2019 11/03/2019 11/02/2019  WBC 4.0 - 10.5 K/uL 11.1(H) 9.9 6.2  Hemoglobin 13.0 - 17.0 g/dL 15.8 16.0 16.6  Hematocrit 39.0 - 52.0 % 46.8 47.4 49.5  Platelets 150 - 400 K/uL 262 247 222    CMP: CMP Latest Ref Rng & Units 12/10/2019 11/04/2019 11/03/2019  Glucose 65 - 99 mg/dL 91 163(H) 144(H)  BUN 6 - 24 mg/dL 19 31(H) 31(H)  Creatinine 0.76 - 1.27 mg/dL 1.12 1.10 0.90  Sodium 134 - 144 mmol/L 145(H) 137 139  Potassium 3.5 - 5.2 mmol/L 3.9 4.4 4.3  Chloride 96 - 106 mmol/L 110(H) 106 107  CO2 20 - 29 mmol/L 22 25 25   Calcium 8.7 - 10.2 mg/dL 9.4 8.2(L) 8.5(L)  Total Protein 6.5 - 8.1 g/dL - 6.1(L) 6.8   Total Bilirubin 0.3 - 1.2 mg/dL - 0.6 0.5  Alkaline Phos 38 - 126 U/L - 61 59  AST 15 - 41 U/L - 27 27  ALT 0 - 44 U/L - 64(H) 65(H)       Carmell Austria, MD 08/23/2021, 2:24 PM  Cc: Garwin Brothers, MD

## 2021-09-03 DIAGNOSIS — U071 COVID-19: Secondary | ICD-10-CM | POA: Diagnosis not present

## 2021-09-21 ENCOUNTER — Encounter: Payer: Self-pay | Admitting: Gastroenterology

## 2021-10-03 DIAGNOSIS — U071 COVID-19: Secondary | ICD-10-CM | POA: Diagnosis not present

## 2021-10-05 ENCOUNTER — Encounter: Payer: Self-pay | Admitting: Gastroenterology

## 2021-10-05 ENCOUNTER — Ambulatory Visit (AMBULATORY_SURGERY_CENTER): Payer: BC Managed Care – PPO | Admitting: Gastroenterology

## 2021-10-05 VITALS — BP 136/88 | HR 95 | Temp 98.4°F | Resp 14 | Ht 69.0 in | Wt 198.0 lb

## 2021-10-05 DIAGNOSIS — D124 Benign neoplasm of descending colon: Secondary | ICD-10-CM

## 2021-10-05 DIAGNOSIS — K635 Polyp of colon: Secondary | ICD-10-CM | POA: Diagnosis not present

## 2021-10-05 DIAGNOSIS — R194 Change in bowel habit: Secondary | ICD-10-CM | POA: Diagnosis not present

## 2021-10-05 DIAGNOSIS — K219 Gastro-esophageal reflux disease without esophagitis: Secondary | ICD-10-CM

## 2021-10-05 DIAGNOSIS — Z1211 Encounter for screening for malignant neoplasm of colon: Secondary | ICD-10-CM | POA: Diagnosis not present

## 2021-10-05 DIAGNOSIS — K2100 Gastro-esophageal reflux disease with esophagitis, without bleeding: Secondary | ICD-10-CM | POA: Diagnosis not present

## 2021-10-05 DIAGNOSIS — K2970 Gastritis, unspecified, without bleeding: Secondary | ICD-10-CM | POA: Diagnosis not present

## 2021-10-05 DIAGNOSIS — K2950 Unspecified chronic gastritis without bleeding: Secondary | ICD-10-CM | POA: Diagnosis not present

## 2021-10-05 MED ORDER — SODIUM CHLORIDE 0.9 % IV SOLN: 500.0000 mL | Freq: Once | INTRAVENOUS | Status: DC

## 2021-10-05 NOTE — Progress Notes (Signed)
Cw vitals and EW IV.

## 2021-10-05 NOTE — Progress Notes (Signed)
Called to room to assist during endoscopic procedure.  Patient ID and intended procedure confirmed with present staff. Received instructions for my participation in the procedure from the performing physician.  

## 2021-10-05 NOTE — Op Note (Signed)
[place] Patient Name: Cesar Pratt [facility] Procedure Date: 10/05/2021 1:22 PM MRN: [id number] Endoscopist: Jackquline Denmark , MD Age: 46 Referring MD:  Date of Birth: [birth year] Gender: Male Account #: [id number] Procedure:                Upper GI endoscopy Indications:              GERD, FH of gastric CA, epi pain Medicines:                Monitored Anesthesia Care Procedure:                Pre-Anesthesia Assessment:                           - Prior to the procedure, a History and Physical                            was performed, and patient medications and                            allergies were reviewed. The patient's tolerance of                            previous anesthesia was also reviewed. The risks                            and benefits of the procedure and the sedation                            options and risks were discussed with the patient.                            All questions were answered, and informed consent                            was obtained. Prior Anticoagulants: The patient has                            taken no previous anticoagulant or antiplatelet                            agents. ASA Grade Assessment: II - A patient with                            mild systemic disease. After reviewing the risks                            and benefits, the patient was deemed in                            satisfactory condition to undergo the procedure.                           After obtaining informed consent, the endoscope was  passed under direct vision. Throughout the                            procedure, the patient's blood pressure, pulse, and                            oxygen saturations were monitored continuously. The                            Endoscope was introduced through the mouth, and                            advanced to the second part of duodenum. The upper                            GI endoscopy was  accomplished without difficulty.                            The patient tolerated the procedure well. Scope In: Scope Out: Findings:                 One tongue of salmon-colored mucosa was present                            from 35 to 36 cm. Biopsies were taken with a cold                            forceps for histology.                           Localized mild inflammation characterized by                            erythema was found in the gastric antrum. Biopsies                            were taken with a cold forceps for histology.                           The examined duodenum was normal. Biopsies for                            histology were taken with a cold forceps for                            evaluation of celiac disease. Complications:            No immediate complications. Estimated Blood Loss:     Estimated blood loss: none. Impression:               - Salmon-colored mucosa suspicious for Barrett's                            esophagus. Biopsied.                           -  Gastritis. Biopsied.                           - Normal examined duodenum. Biopsied. Recommendation:           - Patient has a contact number available for                            emergencies. The signs and symptoms of potential                            delayed complications were discussed with the                            patient. Return to normal activities tomorrow.                            Written discharge instructions were provided to the                            patient.                           - Resume previous diet.                           - Continue present medications.                           - Await pathology results.                           - The findings and recommendations were discussed                            with the patient. Jackquline Denmark, MD 10/05/2021 1:51:25 PM This report has been signed electronically.

## 2021-10-05 NOTE — Progress Notes (Signed)
Report to PACU, RN, vss, BBS= Clear.  

## 2021-10-05 NOTE — Op Note (Signed)
[place] Patient Name: Cesar Pratt [facility] Procedure Date: 10/05/2021 1:22 PM MRN: [id number] Endoscopist: Jackquline Denmark , MD Age: 45 Referring MD:  Date of Birth: 06-14-1976 Gender: Male Account #: [id number] Procedure:                Colonoscopy Indications:              Screening for colorectal malignant neoplasm Medicines:                Monitored Anesthesia Care Procedure:                Pre-Anesthesia Assessment:                           - Prior to the procedure, a History and Physical                            was performed, and patient medications and                            allergies were reviewed. The patient's tolerance of                            previous anesthesia was also reviewed. The risks                            and benefits of the procedure and the sedation                            options and risks were discussed with the patient.                            All questions were answered, and informed consent                            was obtained. Prior Anticoagulants: The patient has                            taken no previous anticoagulant or antiplatelet                            agents. ASA Grade Assessment: II - A patient with                            mild systemic disease. After reviewing the risks                            and benefits, the patient was deemed in                            satisfactory condition to undergo the procedure.                           After obtaining informed consent, the colonoscope  was passed under direct vision. Throughout the                            procedure, the patient's blood pressure, pulse, and                            oxygen saturations were monitored continuously. The                            PCF-HQ190L Colonoscope was introduced through the                            anus and advanced to the 2 cm into the ileum. The                            colonoscopy was  performed without difficulty. The                            patient tolerated the procedure well. The quality                            of the bowel preparation was good. The terminal                            ileum, ileocecal valve, appendiceal orifice, and                            rectum were photographed. Scope In: 1:35:33 PM Scope Out: 1:46:29 PM Scope Withdrawal Time: 0 hours 8 minutes 29 seconds  Total Procedure Duration: 0 hours 10 minutes 56 seconds  Findings:                 A 4 mm polyp was found in the mid descending colon.                            The polyp was sessile. The polyp was removed with a                            cold biopsy forceps. Resection and retrieval were                            complete.                           Multiple medium-mouthed diverticula were found in                            the sigmoid colon and descending colon. Few in the                            ascending and transverse colon.                           Non-bleeding internal hemorrhoids were found during  retroflexion. The hemorrhoids were small and Grade                            I (internal hemorrhoids that do not prolapse).                           The terminal ileum appeared normal.                           The exam was otherwise without abnormality on                            direct and retroflexion views. Complications:            No immediate complications. Estimated Blood Loss:     Estimated blood loss: none. Impression:               - One 4 mm polyp in the mid descending colon,                            removed with a cold biopsy forceps. Resected and                            retrieved.                           - Mild pancolonic diverticulosis predominantly in                            the sigmoid colon.                           - Non-bleeding internal hemorrhoids.                           - The examined portion of the ileum was  normal.                           - The examination was otherwise normal on direct                            and retroflexion views. Recommendation:           - Patient has a contact number available for                            emergencies. The signs and symptoms of potential                            delayed complications were discussed with the                            patient. Return to normal activities tomorrow.                            Written discharge instructions were provided to the  patient.                           - Resume previous diet.                           - Continue present medications.                           - Try Linzess 96mcg every other day.                           - Await pathology results.                           - Repeat colonoscopy for surveillance based on                            pathology results.                           - Return to GI clinic PRN. Jackquline Denmark, MD 10/05/2021 1:55:36 PM This report has been signed electronically.

## 2021-10-05 NOTE — Progress Notes (Signed)
Chief Complaint:   Referring Provider:  Garwin Brothers, MD      ASSESSMENT AND PLAN;   #1. GERD with ?Barrett's esophagus.  #2. FH gastric Ca (Dad)  #3. Change in Bowel habits  #4.  Post COVID syndrome with autonomic dysfunction.   Plan: -EGD/colon -Linzess 68mcg PO QD (samples given) -If continued problems, will perform further work-up.   I discussed EGD/Colonoscopy- the indications, risks, alternatives and potential complications including, but not limited to, bleeding, infection, reaction to medication, damage to internal organs, cardiac and/or pulmonary problems, and perforation requiring surgery (1 to 2 in 1000). The possibility that significant findings could be missed was explained. All ? were answered. The patient gives consent to proceed.  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  Had Covid pneumonia Jan 2021- hospitalized x 6 days-complicated by long COVID including autonomic dysfunction.  Had significant secretions.  Better now with Dupixent.   Here with urinary and fecal incontinence along with constipation.  Also has longstanding gastroesophageal reflux with family history of gastric cancer.  Advised to get EGD/colonoscopy performed.  Has failed trial of Motegrity.  Currently having BM 1/day Previously would have BMs in 3 to 4 days with pellet-like stools.  He has been drinking plenty of water.  Has lost significant weight as below  Wt Readings from Last 3 Encounters:  10/05/21 198 lb (89.8 kg)  08/23/21 198 lb (89.8 kg)  04/20/20 211 lb 6.4 oz (95.9 kg)   Past GI procedures Colonoscopy 02/10/2016 ([facility]): Mild predominantly sigmoid diverticulosis, small internal hemorrhoids. EGD 01/2016: 1 cm salmon-colored tonguelike projection above the GE junction.  Minimal hiatal hernia. Bx-reflux.  No metaplasia.  Past Medical History:  Diagnosis Date   COVID-19 virus  infection    Diverticulitis    GERD (gastroesophageal reflux disease)    Neuromuscular disorder [facility])     Past Surgical History:  Procedure Laterality Date   COLONOSCOPY  02/10/2016   Mild predominantly sigmoid diverticulosis. Small internal hemorrhoids.   COLONOSCOPY WITH ESOPHAGOGASTRODUODENOSCOPY (EGD)     x2 [place]   ESOPHAGOGASTRODUODENOSCOPY  02/10/2016   Questionable short-segment barrett's esophagus (biopsied) Minimal transient hiatal hernia.   wisdon tooth      Family History  Problem Relation Age of Onset   Atrial fibrillation Mother    Heart failure Mother    Stomach cancer Father    Gastric cancer Father    Esophageal cancer Neg Hx    Rectal cancer Neg Hx    Colon cancer Neg Hx     Social History   Tobacco Use   Smoking status: Never   Smokeless tobacco: Never  Vaping Use   Vaping Use: Never used  Substance Use Topics   Alcohol use: Never   Drug use: Never    Current Outpatient Medications  Medication Sig Dispense Refill   cyclobenzaprine (FLEXERIL) 10 MG tablet Take 10 mg by mouth 3 (three) times daily as needed for muscle spasms.     gabapentin (NEURONTIN) 300 MG capsule Take 300 mg by mouth 3 (three) times daily.     MOUNJARO 12.5 MG/0.5ML Pen 2 mLs every 30 (thirty) days.     Current Facility-Administered Medications  Medication Dose Route Frequency Provider Last Rate Last Admin   0.9 %  sodium chloride infusion  500 mL Intravenous Once Jackquline Denmark, MD       dupilumab (DUPIXENT) prefilled syringe 600  mg  600 mg Subcutaneous Q14 Days Kozlow, Donnamarie Poag, MD   600 mg at 04/20/20 1719    Allergies  Allergen Reactions   Minocycline Rash   Other Anaphylaxis    Goat cheese    Review of Systems:  Constitutional: Denies fever, chills, diaphoresis, appetite change and fatigue.  HEENT: Denies photophobia, eye pain, redness, hearing loss, ear pain, congestion, sore throat, rhinorrhea, sneezing, mouth sores, neck pain, neck stiffness and tinnitus.    Respiratory: Denies SOB, DOE, cough, chest tightness,  and wheezing.   Cardiovascular: Denies chest pain, palpitations and leg swelling.  Genitourinary: Denies dysuria, urgency, frequency, hematuria, flank pain and difficulty urinating.  Musculoskeletal: Denies myalgias, back pain, joint swelling, arthralgias and gait problem.  Skin: No rash.  Neurological: Denies dizziness, seizures, syncope, weakness, light-headedness, numbness and headaches.  Hematological: Denies adenopathy. Easy bruising, personal or family bleeding history  Psychiatric/Behavioral: No anxiety or depression     Physical Exam:    BP (!) 142/91    Pulse 91    Temp 98.4 F (36.9 C) (Temporal)    Ht 5\' 9"  (1.753 m)    Wt 198 lb (89.8 kg)    SpO2 96%    BMI 29.24 kg/m  Wt Readings from Last 3 Encounters:  10/05/21 198 lb (89.8 kg)  08/23/21 198 lb (89.8 kg)  04/20/20 211 lb 6.4 oz (95.9 kg)   Constitutional:  Well-developed, in no acute distress. Psychiatric: Normal mood and affect. Behavior is normal. HEENT: Pupils normal.  Conjunctivae are normal. No scleral icterus. Cardiovascular: Normal rate, regular rhythm. No edema Pulmonary/chest: Effort normal and breath sounds normal. No wheezing, rales or rhonchi. Abdominal: Soft, nondistended. Nontender. Bowel sounds active throughout. There are no masses palpable. No hepatomegaly. Rectal: Deferred Neurological: Alert and oriented to person place and time. Skin: Skin is warm and dry. No rashes noted.  Data Reviewed: I have personally reviewed following labs and imaging studies  CBC: CBC Latest Ref Rng & Units 11/04/2019 11/03/2019 11/02/2019  WBC 4.0 - 10.5 K/uL 11.1(H) 9.9 6.2  Hemoglobin 13.0 - 17.0 g/dL 15.8 16.0 16.6  Hematocrit 39.0 - 52.0 % 46.8 47.4 49.5  Platelets 150 - 400 K/uL 262 247 222    CMP: CMP Latest Ref Rng & Units 12/10/2019 11/04/2019 11/03/2019  Glucose 65 - 99 mg/dL 91 163(H) 144(H)  BUN 6 - 24 mg/dL 19 31(H) 31(H)  Creatinine 0.76 - 1.27  mg/dL 1.12 1.10 0.90  Sodium 134 - 144 mmol/L 145(H) 137 139  Potassium 3.5 - 5.2 mmol/L 3.9 4.4 4.3  Chloride 96 - 106 mmol/L 110(H) 106 107  CO2 20 - 29 mmol/L 22 25 25   Calcium 8.7 - 10.2 mg/dL 9.4 8.2(L) 8.5(L)  Total Protein 6.5 - 8.1 g/dL - 6.1(L) 6.8  Total Bilirubin 0.3 - 1.2 mg/dL - 0.6 0.5  Alkaline Phos 38 - 126 U/L - 61 59  AST 15 - 41 U/L - 27 27  ALT 0 - 44 U/L - 64(H) 65(H)       Carmell Austria, MD 10/05/2021, 1:19 PM  Cc: Garwin Brothers, MD

## 2021-10-05 NOTE — Patient Instructions (Signed)
Handouts given for polyps, diverticulosis, Barrett's esophagus and gastritis.  YOU HAD AN ENDOSCOPIC PROCEDURE TODAY AT [facility]:   Refer to the procedure report that was given to you for any specific questions about what was found during the examination.  If the procedure report does not answer your questions, please call your gastroenterologist to clarify.  If you requested that your care partner not be given the details of your procedure findings, then the procedure report has been included in a sealed envelope for you to review at your convenience later.  YOU SHOULD EXPECT: Some feelings of bloating in the abdomen. Passage of more gas than usual.  Walking can help get rid of the air that was put into your GI tract during the procedure and reduce the bloating. If you had a lower endoscopy (such as a colonoscopy or flexible sigmoidoscopy) you may notice spotting of blood in your stool or on the toilet paper. If you underwent a bowel prep for your procedure, you may not have a normal bowel movement for a few days.  Please Note:  You might notice some irritation and congestion in your nose or some drainage.  This is from the oxygen used during your procedure.  There is no need for concern and it should clear up in a day or so.  SYMPTOMS TO REPORT IMMEDIATELY:  Following lower endoscopy (colonoscopy):  Excessive amounts of blood in the stool  Significant tenderness or worsening of abdominal pains  Swelling of the abdomen that is new, acute  Fever of 100F or higher  Following upper endoscopy (EGD)  Vomiting of blood or coffee ground material  New chest pain or pain under the shoulder blades  Painful or persistently difficult swallowing  New shortness of breath  Black, tarry-looking stools  For urgent or emergent issues, a gastroenterologist can be reached at any hour by calling [phone]. Do not use MyChart messaging for urgent concerns.   DIET:  We do recommend a  small meal at first, but then you may proceed to your regular diet.  Drink plenty of fluids but you should avoid alcoholic beverages for 24 hours.  ACTIVITY:  You should plan to take it easy for the rest of today and you should NOT DRIVE or use heavy machinery until tomorrow (because of the sedation medicines used during the test).    FOLLOW UP: Our staff will call the number listed on your records 48-72 hours following your procedure to check on you and address any questions or concerns that you may have regarding the information given to you following your procedure. If we do not reach you, we will leave a message.  We will attempt to reach you two times.  During this call, we will ask if you have developed any symptoms of COVID 19. If you develop any symptoms (ie: fever, flu-like symptoms, shortness of breath, cough etc.) before then, please call [phone].  If you test positive for Covid 19 in the 2 weeks post procedure, please call and report this information to [place].    If any biopsies were taken you will be contacted by phone or by letter within the next 1-3 weeks.  Please call us at [phone] if you have not heard about the biopsies in 3 weeks.    SIGNATURES/CONFIDENTIALITY: You and/or your care partner have signed paperwork which will be entered into your electronic medical record.  These signatures attest to the fact that that the information above on your After  Visit Summary has been reviewed and is understood.  Full responsibility of the confidentiality of this discharge information lies with you and/or your care-partner.

## 2021-10-10 ENCOUNTER — Other Ambulatory Visit: Payer: Self-pay

## 2021-10-10 ENCOUNTER — Telehealth: Payer: Self-pay | Admitting: *Deleted

## 2021-10-10 ENCOUNTER — Other Ambulatory Visit: Payer: Self-pay | Admitting: Gastroenterology

## 2021-10-10 ENCOUNTER — Telehealth: Payer: Self-pay

## 2021-10-10 DIAGNOSIS — R194 Change in bowel habit: Secondary | ICD-10-CM

## 2021-10-10 MED ORDER — LINACLOTIDE 72 MCG PO CAPS: ORAL_CAPSULE | ORAL | 3 refills | Status: DC

## 2021-10-10 NOTE — Telephone Encounter (Signed)
First attempt, no answer and mailbox is full.

## 2021-10-10 NOTE — Telephone Encounter (Signed)
Attempted to reach pt. With follow-up call following endoscopic procedure 10/05/2021.  Unable to LM, pt.'s voice mailbox full.

## 2021-10-12 DIAGNOSIS — H40013 Open angle with borderline findings, low risk, bilateral: Secondary | ICD-10-CM | POA: Diagnosis not present

## 2021-10-15 ENCOUNTER — Encounter: Payer: Self-pay | Admitting: Gastroenterology

## 2021-11-03 DIAGNOSIS — U071 COVID-19: Secondary | ICD-10-CM | POA: Diagnosis not present

## 2021-12-04 DIAGNOSIS — U071 COVID-19: Secondary | ICD-10-CM | POA: Diagnosis not present

## 2022-01-01 DIAGNOSIS — U071 COVID-19: Secondary | ICD-10-CM | POA: Diagnosis not present

## 2022-01-09 ENCOUNTER — Other Ambulatory Visit: Payer: Self-pay | Admitting: Gastroenterology

## 2022-01-09 ENCOUNTER — Other Ambulatory Visit: Payer: Self-pay | Admitting: General Surgery

## 2022-01-09 DIAGNOSIS — Z6829 Body mass index (BMI) 29.0-29.9, adult: Secondary | ICD-10-CM | POA: Diagnosis not present

## 2022-01-09 DIAGNOSIS — R030 Elevated blood-pressure reading, without diagnosis of hypertension: Secondary | ICD-10-CM | POA: Diagnosis not present

## 2022-01-09 DIAGNOSIS — R194 Change in bowel habit: Secondary | ICD-10-CM

## 2022-01-09 DIAGNOSIS — M542 Cervicalgia: Secondary | ICD-10-CM | POA: Diagnosis not present

## 2022-01-09 DIAGNOSIS — E663 Overweight: Secondary | ICD-10-CM | POA: Diagnosis not present

## 2022-01-09 DIAGNOSIS — Z0000 Encounter for general adult medical examination without abnormal findings: Secondary | ICD-10-CM | POA: Diagnosis not present

## 2022-01-09 DIAGNOSIS — M545 Low back pain, unspecified: Secondary | ICD-10-CM | POA: Diagnosis not present

## 2022-01-09 DIAGNOSIS — M797 Fibromyalgia: Secondary | ICD-10-CM | POA: Diagnosis not present

## 2022-01-09 MED ORDER — LINACLOTIDE 72 MCG PO CAPS: ORAL_CAPSULE | ORAL | 3 refills | Status: DC

## 2022-01-10 ENCOUNTER — Other Ambulatory Visit: Payer: Self-pay | Admitting: Gastroenterology

## 2022-01-10 DIAGNOSIS — R194 Change in bowel habit: Secondary | ICD-10-CM

## 2022-02-01 DIAGNOSIS — U071 COVID-19: Secondary | ICD-10-CM | POA: Diagnosis not present

## 2022-03-03 DIAGNOSIS — U071 COVID-19: Secondary | ICD-10-CM | POA: Diagnosis not present

## 2022-04-03 DIAGNOSIS — U071 COVID-19: Secondary | ICD-10-CM | POA: Diagnosis not present

## 2022-05-03 DIAGNOSIS — U071 COVID-19: Secondary | ICD-10-CM | POA: Diagnosis not present

## 2022-05-08 DIAGNOSIS — M6289 Other specified disorders of muscle: Secondary | ICD-10-CM | POA: Diagnosis not present

## 2022-05-08 DIAGNOSIS — M797 Fibromyalgia: Secondary | ICD-10-CM | POA: Diagnosis not present

## 2022-05-08 DIAGNOSIS — I10 Essential (primary) hypertension: Secondary | ICD-10-CM | POA: Diagnosis not present

## 2022-06-03 DIAGNOSIS — U071 COVID-19: Secondary | ICD-10-CM | POA: Diagnosis not present

## 2022-07-04 DIAGNOSIS — U071 COVID-19: Secondary | ICD-10-CM | POA: Diagnosis not present

## 2022-08-03 DIAGNOSIS — U071 COVID-19: Secondary | ICD-10-CM | POA: Diagnosis not present

## 2022-09-03 DIAGNOSIS — U071 COVID-19: Secondary | ICD-10-CM | POA: Diagnosis not present

## 2022-10-03 DIAGNOSIS — U071 COVID-19: Secondary | ICD-10-CM | POA: Diagnosis not present

## 2022-10-05 ENCOUNTER — Other Ambulatory Visit: Payer: Self-pay

## 2022-10-05 MED ORDER — GABAPENTIN 100 MG PO CAPS: 100.0000 mg | ORAL_CAPSULE | Freq: Two times a day (BID) | ORAL | 1 refills | Status: DC

## 2022-11-03 DIAGNOSIS — U071 COVID-19: Secondary | ICD-10-CM | POA: Diagnosis not present

## 2023-01-08 DIAGNOSIS — M5031 Other cervical disc degeneration,  high cervical region: Secondary | ICD-10-CM | POA: Diagnosis not present

## 2023-01-08 DIAGNOSIS — M50321 Other cervical disc degeneration at C4-C5 level: Secondary | ICD-10-CM | POA: Diagnosis not present

## 2023-01-08 DIAGNOSIS — M9901 Segmental and somatic dysfunction of cervical region: Secondary | ICD-10-CM | POA: Diagnosis not present

## 2023-01-08 DIAGNOSIS — M5413 Radiculopathy, cervicothoracic region: Secondary | ICD-10-CM | POA: Diagnosis not present

## 2023-01-14 DIAGNOSIS — M5413 Radiculopathy, cervicothoracic region: Secondary | ICD-10-CM | POA: Diagnosis not present

## 2023-01-14 DIAGNOSIS — M9901 Segmental and somatic dysfunction of cervical region: Secondary | ICD-10-CM | POA: Diagnosis not present

## 2023-01-14 DIAGNOSIS — M50321 Other cervical disc degeneration at C4-C5 level: Secondary | ICD-10-CM | POA: Diagnosis not present

## 2023-01-14 DIAGNOSIS — M5031 Other cervical disc degeneration,  high cervical region: Secondary | ICD-10-CM | POA: Diagnosis not present

## 2023-01-16 DIAGNOSIS — M5031 Other cervical disc degeneration,  high cervical region: Secondary | ICD-10-CM | POA: Diagnosis not present

## 2023-01-16 DIAGNOSIS — M50321 Other cervical disc degeneration at C4-C5 level: Secondary | ICD-10-CM | POA: Diagnosis not present

## 2023-01-16 DIAGNOSIS — M9901 Segmental and somatic dysfunction of cervical region: Secondary | ICD-10-CM | POA: Diagnosis not present

## 2023-01-16 DIAGNOSIS — M5413 Radiculopathy, cervicothoracic region: Secondary | ICD-10-CM | POA: Diagnosis not present

## 2023-01-17 DIAGNOSIS — M5413 Radiculopathy, cervicothoracic region: Secondary | ICD-10-CM | POA: Diagnosis not present

## 2023-01-17 DIAGNOSIS — M9901 Segmental and somatic dysfunction of cervical region: Secondary | ICD-10-CM | POA: Diagnosis not present

## 2023-01-17 DIAGNOSIS — M5031 Other cervical disc degeneration,  high cervical region: Secondary | ICD-10-CM | POA: Diagnosis not present

## 2023-01-17 DIAGNOSIS — M50321 Other cervical disc degeneration at C4-C5 level: Secondary | ICD-10-CM | POA: Diagnosis not present

## 2023-01-23 DIAGNOSIS — M5413 Radiculopathy, cervicothoracic region: Secondary | ICD-10-CM | POA: Diagnosis not present

## 2023-01-23 DIAGNOSIS — M9901 Segmental and somatic dysfunction of cervical region: Secondary | ICD-10-CM | POA: Diagnosis not present

## 2023-01-23 DIAGNOSIS — M50321 Other cervical disc degeneration at C4-C5 level: Secondary | ICD-10-CM | POA: Diagnosis not present

## 2023-01-23 DIAGNOSIS — M5031 Other cervical disc degeneration,  high cervical region: Secondary | ICD-10-CM | POA: Diagnosis not present

## 2023-01-31 DIAGNOSIS — M5413 Radiculopathy, cervicothoracic region: Secondary | ICD-10-CM | POA: Diagnosis not present

## 2023-01-31 DIAGNOSIS — M9901 Segmental and somatic dysfunction of cervical region: Secondary | ICD-10-CM | POA: Diagnosis not present

## 2023-01-31 DIAGNOSIS — M50321 Other cervical disc degeneration at C4-C5 level: Secondary | ICD-10-CM | POA: Diagnosis not present

## 2023-01-31 DIAGNOSIS — M5031 Other cervical disc degeneration,  high cervical region: Secondary | ICD-10-CM | POA: Diagnosis not present

## 2023-04-05 ENCOUNTER — Other Ambulatory Visit: Payer: Self-pay

## 2023-04-05 MED ORDER — GABAPENTIN 100 MG PO CAPS: ORAL_CAPSULE | ORAL | 1 refills | Status: DC

## 2023-07-04 ENCOUNTER — Other Ambulatory Visit: Payer: Self-pay | Admitting: Student

## 2023-07-04 MED ORDER — AMOXICILLIN-POT CLAVULANATE 875-125 MG PO TABS: 1.0000 | ORAL_TABLET | Freq: Two times a day (BID) | ORAL | 0 refills | Status: DC

## 2023-07-22 ENCOUNTER — Other Ambulatory Visit: Payer: Self-pay | Admitting: Student

## 2023-07-22 DIAGNOSIS — K5732 Diverticulitis of large intestine without perforation or abscess without bleeding: Secondary | ICD-10-CM | POA: Diagnosis not present

## 2023-07-22 DIAGNOSIS — R1032 Left lower quadrant pain: Secondary | ICD-10-CM | POA: Diagnosis not present

## 2023-07-22 DIAGNOSIS — K5792 Diverticulitis of intestine, part unspecified, without perforation or abscess without bleeding: Secondary | ICD-10-CM | POA: Diagnosis not present

## 2023-07-22 DIAGNOSIS — K769 Liver disease, unspecified: Secondary | ICD-10-CM | POA: Diagnosis not present

## 2023-07-22 DIAGNOSIS — R932 Abnormal findings on diagnostic imaging of liver and biliary tract: Secondary | ICD-10-CM | POA: Diagnosis not present

## 2023-07-22 MED ORDER — CIPROFLOXACIN HCL 500 MG PO TABS: 500.0000 mg | ORAL_TABLET | Freq: Two times a day (BID) | ORAL | 0 refills | Status: AC

## 2023-07-22 MED ORDER — METRONIDAZOLE 500 MG PO TABS: 500.0000 mg | ORAL_TABLET | Freq: Three times a day (TID) | ORAL | 0 refills | Status: AC

## 2023-07-26 ENCOUNTER — Other Ambulatory Visit: Payer: Self-pay

## 2023-07-26 ENCOUNTER — Ambulatory Visit: Payer: Self-pay

## 2023-07-26 DIAGNOSIS — Z23 Encounter for immunization: Secondary | ICD-10-CM | POA: Diagnosis not present

## 2023-07-26 NOTE — Progress Notes (Signed)
Nurse visit flu shot only.

## 2023-07-29 NOTE — Progress Notes (Signed)
Flu shot only

## 2023-08-23 ENCOUNTER — Other Ambulatory Visit: Payer: Self-pay | Admitting: Internal Medicine

## 2023-08-23 MED ORDER — GABAPENTIN 100 MG PO CAPS: ORAL_CAPSULE | ORAL | 2 refills | Status: DC

## 2023-09-20 ENCOUNTER — Other Ambulatory Visit: Payer: Self-pay | Admitting: Internal Medicine

## 2023-09-20 MED ORDER — ALPRAZOLAM 0.5 MG PO TABS: 0.5000 mg | ORAL_TABLET | Freq: Every evening | ORAL | 0 refills | Status: DC | PRN

## 2023-09-20 NOTE — Progress Notes (Signed)
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

## 2023-10-24 ENCOUNTER — Other Ambulatory Visit: Payer: Self-pay

## 2023-10-24 MED ORDER — CIPROFLOXACIN HCL 500 MG PO TABS: 500.0000 mg | ORAL_TABLET | Freq: Two times a day (BID) | ORAL | 0 refills | Status: AC

## 2023-10-24 MED ORDER — METRONIDAZOLE 500 MG PO TABS: 500.0000 mg | ORAL_TABLET | Freq: Three times a day (TID) | ORAL | 0 refills | Status: AC

## 2023-11-08 ENCOUNTER — Other Ambulatory Visit: Payer: Self-pay

## 2023-11-08 MED ORDER — METRONIDAZOLE 500 MG PO TABS: 500.0000 mg | ORAL_TABLET | Freq: Three times a day (TID) | ORAL | 0 refills | Status: AC

## 2023-11-08 MED ORDER — CIPROFLOXACIN HCL 500 MG PO TABS: 500.0000 mg | ORAL_TABLET | Freq: Two times a day (BID) | ORAL | 0 refills | Status: AC

## 2023-11-08 MED ORDER — DICLOFENAC SODIUM 75 MG PO TBEC: 75.0000 mg | DELAYED_RELEASE_TABLET | Freq: Two times a day (BID) | ORAL | 0 refills | Status: AC

## 2023-11-23 ENCOUNTER — Other Ambulatory Visit: Payer: Self-pay | Admitting: Student

## 2023-11-23 MED ORDER — DICLOFENAC SODIUM 75 MG PO TBEC: 75.0000 mg | DELAYED_RELEASE_TABLET | Freq: Two times a day (BID) | ORAL | 0 refills | Status: DC

## 2023-11-23 MED ORDER — CEPHALEXIN 500 MG PO CAPS: 500.0000 mg | ORAL_CAPSULE | Freq: Three times a day (TID) | ORAL | 0 refills | Status: AC

## 2023-12-20 ENCOUNTER — Other Ambulatory Visit: Payer: Self-pay | Admitting: Student

## 2023-12-30 ENCOUNTER — Other Ambulatory Visit: Payer: Self-pay

## 2023-12-30 MED ORDER — GABAPENTIN 100 MG PO CAPS: ORAL_CAPSULE | ORAL | 2 refills | Status: DC

## 2023-12-30 MED ORDER — CYCLOBENZAPRINE HCL 10 MG PO TABS: 10.0000 mg | ORAL_TABLET | Freq: Three times a day (TID) | ORAL | 1 refills | Status: DC | PRN

## 2024-01-24 ENCOUNTER — Other Ambulatory Visit: Payer: Self-pay

## 2024-01-24 MED ORDER — GABAPENTIN 100 MG PO CAPS: ORAL_CAPSULE | ORAL | 2 refills | Status: DC

## 2024-01-27 ENCOUNTER — Other Ambulatory Visit: Payer: Self-pay | Admitting: Internal Medicine

## 2024-01-30 ENCOUNTER — Other Ambulatory Visit: Payer: Self-pay | Admitting: Student

## 2024-01-30 MED ORDER — TADALAFIL 20 MG PO TABS: 20.0000 mg | ORAL_TABLET | Freq: Every day | ORAL | 2 refills | Status: DC | PRN

## 2024-02-13 ENCOUNTER — Encounter: Payer: Self-pay | Admitting: Gastroenterology

## 2024-02-21 ENCOUNTER — Telehealth: Admitting: Student

## 2024-02-21 DIAGNOSIS — G608 Other hereditary and idiopathic neuropathies: Secondary | ICD-10-CM | POA: Insufficient documentation

## 2024-02-21 MED ORDER — DICLOFENAC SODIUM 75 MG PO TBEC: 75.0000 mg | DELAYED_RELEASE_TABLET | Freq: Two times a day (BID) | ORAL | 1 refills | Status: DC

## 2024-02-21 MED ORDER — DIAZEPAM 5 MG PO TABS: 5.0000 mg | ORAL_TABLET | Freq: Every day | ORAL | 0 refills | Status: DC

## 2024-02-21 NOTE — Assessment & Plan Note (Signed)
 The patient is experiencing worsening lower extremity tightness specifically at bedtime. Current measures of massage therapy, percussion gun, Gabapentin , NSAID and cyclobenzaprine .    -Tests None indicated at this time.  -Plan  Diazepam 5 mg at bedtime prn for muscle spasm. Continue diclofenac  sodium 75 mg refilled via phone. Continue massage therapy   -Education -advised to use caution with medications due to sedative effects.  -discussed use of orthotics or Intersol's and supportive shoes

## 2024-02-21 NOTE — Progress Notes (Signed)
   Established Patient Office Visit  Subjective   Patient ID: Cesar Pratt, male    DOB: [birth year]  Age: 48 y.o. MRN: [id number]  Chief Complaint  Patient presents with   Spasms    Worsening neuropathy and spasm in lower extremity.     HPI I connected with  Cesar Pratt on 02/21/24 by a video enabled telemedicine application and verified that I am speaking with the correct person using two identifiers.   I discussed the limitations of evaluation and management by telemedicine. The patient expressed understanding and agreed to proceed.  Cesar Pratt is a 48 year old male who presents for worsening bilateral lower extermity muscle tightness. On chart review, patient was evaluated by neurology who did EMG and biopsy which were negative. It was felt to have a small fiber neuropathy possibly related to an auto-immune response to COVID. 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 For relief he has been using diclofenac  75 mg, tylenol  500 mg, and cyclobenzaprine  10 mg. Recently gabapentin  was increased from 200 mg once daily to 200 mg twice daily. The patient rates pain 1/10 with medication otherwise 5/10 during the day. He currently denies recent falls, weakness, balance issues, or changes in tone or skin of lower extremities. He does reports receiving massage therapy for 2 hrs a week and use of percussion gun at night time.       Review of Systems  Neurological:  Negative for weakness.  All other systems reviewed and are negative.     Objective:     There were no vitals taken for this visit.   Physical Exam Constitutional:      Appearance: Normal appearance. He is obese.  Musculoskeletal:     Right lower leg: No swelling. No edema.     Left lower leg: No swelling. No edema.  Skin:    General: Skin is warm and dry.  Neurological:      General: No focal deficit present.     Mental Status: He is alert and oriented to person, place, and time. Mental status is at baseline.  Psychiatric:        Mood and Affect: Mood normal.        Behavior: Behavior normal.   Telehealth visit: Physical exam was not completed.     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 Current measures of massage therapy, percussion gun, Gabapentin , NSAID and cyclobenzaprine .    -Tests None indicated at this time.  -Plan  Diazepam 5 mg at bedtime prn for muscle spasm. Continue diclofenac  sodium 75 mg refilled via phone. Continue massage therapy   -Education -advised to use caution with medications due to sedative effects.  -discussed use of orthotics or Intersol's and supportive shoes           No follow-ups on file.    Cesar Badder, NP

## 2024-02-24 ENCOUNTER — Other Ambulatory Visit: Payer: Self-pay | Admitting: Student

## 2024-02-27 ENCOUNTER — Other Ambulatory Visit: Payer: Self-pay | Admitting: Internal Medicine

## 2024-05-27 ENCOUNTER — Other Ambulatory Visit: Payer: Self-pay

## 2024-05-27 DIAGNOSIS — G608 Other hereditary and idiopathic neuropathies: Secondary | ICD-10-CM

## 2024-05-27 MED ORDER — CYCLOBENZAPRINE HCL 10 MG PO TABS: 10.0000 mg | ORAL_TABLET | Freq: Three times a day (TID) | ORAL | 1 refills | Status: AC | PRN

## 2024-05-27 MED ORDER — DICLOFENAC SODIUM 75 MG PO TBEC: 75.0000 mg | DELAYED_RELEASE_TABLET | Freq: Two times a day (BID) | ORAL | 1 refills | Status: DC

## 2024-05-27 MED ORDER — GABAPENTIN 100 MG PO CAPS: ORAL_CAPSULE | ORAL | 2 refills | Status: AC

## 2024-05-27 MED ORDER — AMOXICILLIN-POT CLAVULANATE 250-62.5 MG/5ML PO SUSR: 500.0000 mg | Freq: Two times a day (BID) | ORAL | 0 refills | Status: AC

## 2024-05-27 MED ORDER — TADALAFIL 20 MG PO TABS: 20.0000 mg | ORAL_TABLET | Freq: Every day | ORAL | 2 refills | Status: DC | PRN

## 2024-05-27 MED ORDER — METRONIDAZOLE 500 MG PO TABS: 500.0000 mg | ORAL_TABLET | Freq: Three times a day (TID) | ORAL | 0 refills | Status: AC

## 2024-08-12 ENCOUNTER — Other Ambulatory Visit: Payer: Self-pay

## 2024-08-12 DIAGNOSIS — G608 Other hereditary and idiopathic neuropathies: Secondary | ICD-10-CM

## 2024-08-12 MED ORDER — TADALAFIL 20 MG PO TABS: 20.0000 mg | ORAL_TABLET | Freq: Every day | ORAL | 5 refills | Status: AC | PRN

## 2024-08-12 MED ORDER — DICLOFENAC SODIUM 75 MG PO TBEC
75.0000 mg | DELAYED_RELEASE_TABLET | Freq: Two times a day (BID) | ORAL | 1 refills | Status: AC
Start: 2024-08-12 — End: ?

## 2024-08-12 MED ORDER — PANTOPRAZOLE SODIUM 40 MG PO TBEC: 40.0000 mg | DELAYED_RELEASE_TABLET | Freq: Every day | ORAL | 1 refills | Status: AC

## 2024-08-28 ENCOUNTER — Other Ambulatory Visit: Payer: Self-pay

## 2024-08-28 MED ORDER — AMOXICILLIN-POT CLAVULANATE 875-125 MG PO TABS: 1.0000 | ORAL_TABLET | Freq: Two times a day (BID) | ORAL | 0 refills | Status: DC

## 2024-10-13 ENCOUNTER — Other Ambulatory Visit: Payer: Self-pay

## 2024-10-13 MED ORDER — AMOXICILLIN-POT CLAVULANATE 875-125 MG PO TABS: 1.0000 | ORAL_TABLET | Freq: Two times a day (BID) | ORAL | 0 refills | Status: AC
